# Patient Record
Sex: Male | Born: 1965
Health system: Southern US, Community
[De-identification: ages and names within clinical notes are randomized; demographics above are authoritative.]

## PROBLEM LIST (undated history)

## (undated) DIAGNOSIS — K219 Gastro-esophageal reflux disease without esophagitis: Secondary | ICD-10-CM

## (undated) DIAGNOSIS — A048 Other specified bacterial intestinal infections: Secondary | ICD-10-CM

## (undated) DIAGNOSIS — G4733 Obstructive sleep apnea (adult) (pediatric): Secondary | ICD-10-CM

## (undated) DIAGNOSIS — E785 Hyperlipidemia, unspecified: Secondary | ICD-10-CM

## (undated) DIAGNOSIS — U071 COVID-19: Secondary | ICD-10-CM

## (undated) DIAGNOSIS — E119 Type 2 diabetes mellitus without complications: Secondary | ICD-10-CM

## (undated) DIAGNOSIS — T7840XA Allergy, unspecified, initial encounter: Secondary | ICD-10-CM

## (undated) HISTORY — DX: Hyperlipidemia, unspecified: E78.5

## (undated) HISTORY — DX: Allergy, unspecified, initial encounter: T78.40XA

## (undated) HISTORY — DX: Obstructive sleep apnea (adult) (pediatric): G47.33

## (undated) HISTORY — DX: Other specified bacterial intestinal infections: A04.8

## (undated) HISTORY — PX: SUPERFICIAL LYMPH NODE BIOPSY / EXCISION: SUR127

## (undated) HISTORY — DX: Gastro-esophageal reflux disease without esophagitis: K21.9

---

## 2003-08-16 ENCOUNTER — Emergency Department (HOSPITAL_COMMUNITY): Admission: EM | Admit: 2003-08-16 | Discharge: 2003-08-16 | Payer: Self-pay

## 2009-04-29 ENCOUNTER — Ambulatory Visit (HOSPITAL_COMMUNITY): Admission: RE | Admit: 2009-04-29 | Payer: Self-pay | Source: Ambulatory Visit | Admitting: General Surgery

## 2011-04-04 ENCOUNTER — Encounter (INDEPENDENT_AMBULATORY_CARE_PROVIDER_SITE_OTHER): Payer: Self-pay | Admitting: General Surgery

## 2011-04-18 ENCOUNTER — Other Ambulatory Visit (INDEPENDENT_AMBULATORY_CARE_PROVIDER_SITE_OTHER): Payer: Self-pay | Admitting: General Surgery

## 2011-04-18 ENCOUNTER — Encounter (HOSPITAL_COMMUNITY): Payer: BC Managed Care – PPO | Attending: General Surgery

## 2011-04-18 DIAGNOSIS — Z01812 Encounter for preprocedural laboratory examination: Secondary | ICD-10-CM | POA: Insufficient documentation

## 2011-04-18 LAB — BASIC METABOLIC PANEL
BUN: 17 mg/dL (ref 6–23)
CO2: 25 mEq/L (ref 19–32)
Calcium: 9.7 mg/dL (ref 8.4–10.5)
Creatinine, Ser: 0.95 mg/dL (ref 0.50–1.35)
Glucose, Bld: 89 mg/dL (ref 70–99)

## 2011-04-18 LAB — CBC
Hemoglobin: 15.7 g/dL (ref 13.0–17.0)
MCH: 31.8 pg (ref 26.0–34.0)
MCV: 91.1 fL (ref 78.0–100.0)
RBC: 4.93 MIL/uL (ref 4.22–5.81)

## 2011-04-18 LAB — DIFFERENTIAL
Lymphs Abs: 2 10*3/uL (ref 0.7–4.0)
Monocytes Relative: 9 % (ref 3–12)
Neutro Abs: 1.3 10*3/uL — ABNORMAL LOW (ref 1.7–7.7)
Neutrophils Relative %: 35 % — ABNORMAL LOW (ref 43–77)

## 2011-04-19 ENCOUNTER — Encounter (INDEPENDENT_AMBULATORY_CARE_PROVIDER_SITE_OTHER): Payer: Self-pay | Admitting: General Surgery

## 2011-04-19 ENCOUNTER — Telehealth (INDEPENDENT_AMBULATORY_CARE_PROVIDER_SITE_OTHER): Payer: Self-pay | Admitting: General Surgery

## 2011-04-19 NOTE — Telephone Encounter (Signed)
Spoke to [place] at [employer] pre adm and made aware patient's labs are ok for surgery. Pt scheduled for surgery 04/30/11.

## 2011-04-30 ENCOUNTER — Ambulatory Visit (HOSPITAL_COMMUNITY): Admission: RE | Admit: 2011-04-30 | Payer: BC Managed Care – PPO | Source: Ambulatory Visit | Admitting: General Surgery

## 2011-05-20 ENCOUNTER — Ambulatory Visit (HOSPITAL_COMMUNITY): Admission: RE | Admit: 2011-05-20 | Payer: BC Managed Care – PPO | Source: Ambulatory Visit | Admitting: General Surgery

## 2011-05-22 ENCOUNTER — Encounter (INDEPENDENT_AMBULATORY_CARE_PROVIDER_SITE_OTHER): Payer: Self-pay | Admitting: General Surgery

## 2011-07-10 ENCOUNTER — Ambulatory Visit (INDEPENDENT_AMBULATORY_CARE_PROVIDER_SITE_OTHER): Payer: BC Managed Care – PPO | Admitting: General Surgery

## 2011-07-10 ENCOUNTER — Encounter (INDEPENDENT_AMBULATORY_CARE_PROVIDER_SITE_OTHER): Payer: Self-pay | Admitting: General Surgery

## 2011-07-10 VITALS — BP 130/98 | HR 70 | Temp 96.3°F | Resp 14 | Ht 73.0 in | Wt 226.1 lb

## 2011-07-10 DIAGNOSIS — K602 Anal fissure, unspecified: Secondary | ICD-10-CM | POA: Insufficient documentation

## 2011-07-10 DIAGNOSIS — K648 Other hemorrhoids: Secondary | ICD-10-CM | POA: Insufficient documentation

## 2011-07-10 MED ORDER — AMBULATORY NON FORMULARY MEDICATION: 1.0000 "application " | Freq: Two times a day (BID) | Status: DC

## 2011-07-10 NOTE — Progress Notes (Signed)
Chief Complaint  Patient presents with  . Other    reck hems for surgery     HPI Isaiah Spencer is a 45 y.o. male.  HPI 45 year old [place] American male who initially saw back in June 2012 for internal hemorrhoids comes in today for long-term followup. We eventually scheduled surgery; however, the patient backed out of surgery because he was scared. He states that he still has pain with defecation. He states that he has a bowel movement about every other day. He is now only sitting on the commode for 10 minutes instead of 20 minutes at a time when he was a few months ago. He states that the hemorrhoids still pop out when he has a bowel movement. Most of the time the hemorrhoids will self reduce but occasionally he has to manually reduce them. He still has bleeding with bowel movements on occasion. He states that he will have pain for several more hours after having a bowel movement. He is not taking any supplemental fiber. He is not really eating any food that are high in fiber. He is only drinking one bottle of water per day.  He also states that he has been having trouble swallowing pills and that his reflux is worse. He is scheduled to see Dr. Merla Riches in followup for his reflux problems. Past Medical History  Diagnosis Date  . GERD (gastroesophageal reflux disease)   . Hyperlipemia   . Hemorrhoids     Past Surgical History  Procedure Date  . Superficial lymph node biopsy / excision 90's    non cancerous    Family History  Problem Relation Age of Onset  . Heart disease Mother 31    heart attack     Social History History  Substance Use Topics  . Smoking status: Never Smoker   . Smokeless tobacco: Not on file  . Alcohol Use: Yes     socially    Allergies  Allergen Reactions  . Augmentin   . Penicillins     Current Outpatient Prescriptions  Medication Sig Dispense Refill  . omeprazole (PRILOSEC) 40 MG capsule Take 40 mg by mouth daily.        . AMBULATORY NON  FORMULARY MEDICATION Place 1 application rectally 2 (two) times daily. Medication Name: Nifedipine Ointment 2%  30 g  1    Review of Systems Review of Systems  Constitutional: Negative for fever, activity change, appetite change and unexpected weight change.  HENT: Negative for nosebleeds and congestion.   Eyes: Negative for photophobia and redness.  Respiratory: Positive for cough. Negative for shortness of breath and wheezing.   Cardiovascular: Negative for chest pain and palpitations.  Gastrointestinal: Positive for rectal pain. Negative for abdominal pain and abdominal distention.       See hpi  Genitourinary: Negative for dysuria, hematuria, difficulty urinating and testicular pain.  Musculoskeletal: Negative for back pain and arthralgias.  Skin: Negative for pallor and rash.  Neurological: Negative.   Hematological: Negative.   Psychiatric/Behavioral: Negative.     Blood pressure 130/98, pulse 70, temperature 96.3 F (35.7 C), resp. rate 14, height 6\' 1"  (1.854 m), weight 226 lb 2 oz (102.57 kg).  Physical Exam Physical Exam  Vitals reviewed. Constitutional: He is oriented to person, place, and time. He appears well-developed and well-nourished.  HENT:  Head: Normocephalic and atraumatic.  Eyes: Conjunctivae are normal. No scleral icterus.  Neck: Normal range of motion. Neck supple. No tracheal deviation present.  Cardiovascular: Normal rate and regular rhythm.  Pulmonary/Chest: Effort normal and breath sounds normal. He has no wheezes.  Abdominal: Soft. Bowel sounds are normal. He exhibits no distension. There is no tenderness.       Tiny umbilical hernia  Genitourinary: Rectal exam shows fissure.       Anal fissure in post midline; L lateral mixed column hemorrhoid. DRE & anoscopy deferred secondary to fissure  Musculoskeletal: Normal range of motion. He exhibits no edema.  Lymphadenopathy:    He has no cervical adenopathy.  Neurological: He is alert and oriented to  person, place, and time. He exhibits normal muscle tone.  Skin: Skin is warm and dry.  Psychiatric: He has a normal mood and affect. His behavior is normal. Judgment normal.    Data Reviewed My note from 6/14 which showed grade 3 two-column internal hemorrhoids  Assessment    Acute Anal Fissure Two-column internal hemorrhoids    Plan    He has now developed an anal fissure in addition to his internal hemorrhoids.  I am reluctant to operate on his hemorrhoids at this time given his anal fissure. He was given educational material about anal fissures. I stressed to him the importance of increasing his daily water intake as well as increasing his daily fiber intake. We also talked about the importance of sitz baths. We're going to put him on nifedipine ointment twice a day. I also encouraged him to take supplemental fiber. I will see him back in 6 weeks.       Gaynelle Adu M 07/10/2011, 9:09 AM

## 2011-07-10 NOTE — Patient Instructions (Signed)
Anal Fissure An anal fissure often begins with sharp pain. This is usually following a bowel movement. It often causes bright red blood stained stools. It is the most common cause of rectal bleeding. One common cause of this is passage of a large, hard stool. It can also be caused by having frequent diarrheal stools. Anal fissures that occur for a longtime (chronic) may require surgery. CAUSES  Passing large, hard stools.   Frequent diarrheal stools.   Constipation.  SYMPTOMS  Bright red, blood stained stools.   Rectal bleeding.  HOME CARE INSTRUCTIONS  If constipation is the cause of the rectal fissure, it may be necessary to add a bulk-forming laxative. A diet high in fruits, whole grains, and vegetables will also help.   Taking warm sitz baths for 1 half hour 4 times per day may help.   Increase your fluid intake.   Only take over-the-counter or prescription medicines for pain, discomfort, or fever as directed by your caregiver. Do not take aspirin as this may increase the tendency for bleeding.   Do not use ointments containing anesthetic medications or hydrocortisone. They could slow healing.   Avoid constipating foods such as bananas and cheese.   In children, brown Karo syrup may be used by adding 1 teaspoon of syrup to 8 ounces of formula. An alternative is to give 3 teaspoons of mineral oil every day.  SEEK MEDICAL CARE IF: Rectal bleeding continues, changes in intensity, or becomes more severe. MAKE SURE YOU:   Understand these instructions.   Will watch your condition.   Will get help right away if you are not doing well or get worse.  Document Released: 10/07/2005 Document Re-Released: 01/01/2010 [facility] Patient Information 2011 [place], [place].  GETTING TO GOOD BOWEL HEALTH. Irregular bowel habits such as constipation and diarrhea can lead to many problems over time.  Having one soft bowel movement a day is the most important way to prevent further problems.   The anorectal canal is designed to handle stretching and feces to safely manage our ability to get rid of solid waste (feces, poop, stool) out of our body.  BUT, hard constipated stools can act like ripping concrete bricks and diarrhea can be a burning fire to this very sensitive area of our body, causing inflamed hemorrhoids, anal fissures, increasing risk is perirectal abscesses, abdominal pain/bloating, an making irritable bowel worse.     The goal: ONE SOFT BOWEL MOVEMENT A DAY!  To have soft, regular bowel movements:    Drink at least 8 tall glasses of water a day.     Take plenty of fiber.  Fiber is the undigested part of plant food that passes into the colon, acting s "natures broom" to encourage bowel motility and movement.  Fiber can absorb and hold large amounts of water. This results in a larger, bulkier stool, which is soft and easier to pass. Work gradually over several weeks up to 6 servings a day of fiber (25g a day even more if needed) in the form of: o Vegetables -- Root (potatoes, carrots, turnips), leafy green (lettuce, salad greens, celery, spinach), or cooked high residue (cabbage, broccoli, etc) o Fruit -- Fresh (unpeeled skin & pulp), Dried (prunes, apricots, cherries, etc ),  or stewed ( applesauce)  o Whole grain breads, pasta, etc (whole wheat)  o Bran cereals    Bulking Agents -- This type of water-retaining fiber generally is easily obtained each day by one of the following:  o Psyllium bran -- The  psyllium plant is remarkable because its ground seeds can retain so much water. This product is available as Metamucil, Konsyl, Effersyllium, Per Diem Fiber, or the less expensive generic preparation in drug and health food stores. Although labeled a laxative, it really is not a laxative.  o Methylcellulose -- This is another fiber derived from wood which also retains water. It is available as Citrucel. o Polyethylene Glycol - and "artificial" fiber commonly called Miralax or  Glycolax.  It is helpful for people with gassy or bloated feelings with regular fiber o Flax Seed - a less gassy fiber than psyllium   No reading or other relaxing activity while on the toilet. If bowel movements take longer than 5 minutes, you are too constipated   AVOID CONSTIPATION.  High fiber and water intake usually takes care of this.  Sometimes a laxative is needed to stimulate more frequent bowel movements, but    Laxatives are not a good long-term solution as it can wear the colon out. o Osmotics (Milk of Magnesia, Fleets phosphosoda, Magnesium citrate, MiraLax, GoLytely) are safer than  o Stimulants (Senokot, Castor Oil, Dulcolax, Ex Lax)    o Do not take laxatives for more than 7days in a row.    IF SEVERELY CONSTIPATED, try a Bowel Retraining Program: o Do not use laxatives.  o Eat a diet high in roughage, such as bran cereals and leafy vegetables.  o Drink six (6) ounces of prune or apricot juice each morning.  o Eat two (2) large servings of stewed fruit each day.  o Take one (1) heaping tablespoon of a psyllium-based bulking agent twice a day. Use sugar-free sweetener when possible to avoid excessive calories.  o Eat a normal breakfast.  o Set aside 15 minutes after breakfast to sit on the toilet, but do not strain to have a bowel movement.  o If you do not have a bowel movement by the third day, use an enema and repeat the above steps.    Controlling diarrhea o Switch to liquids and simpler foods for a few days to avoid stressing your intestines further. o Avoid dairy products (especially milk & ice cream) for a short time.  The intestines often can lose the ability to digest lactose when stressed. o Avoid foods that cause gassiness or bloating.  Typical foods include beans and other legumes, cabbage, broccoli, and dairy foods.  Every person has some sensitivity to other foods, so listen to our body and avoid those foods that trigger problems for you. o Adding fiber (Citrucel,  Metamucil, psyllium, Miralax) gradually can help thicken stools by absorbing excess fluid and retrain the intestines to act more normally.  Slowly increase the dose over a few weeks.  Too much fiber too soon can backfire and cause cramping & bloating. o Probiotics (such as active yogurt, Align, etc) may help repopulate the intestines and colon with normal bacteria and calm down a sensitive digestive tract.  Most studies show it to be of mild help, though, and such products can be costly. o Medicines:   Bismuth subsalicylate (ex. Kayopectate, Pepto Bismol) every 30 minutes for up to 6 doses can help control diarrhea.  Avoid if pregnant.   Loperamide (Immodium) can slow down diarrhea.  Start with two tablets (4mg  total) first and then try one tablet every 6 hours.  Avoid if you are having fevers or severe pain.  If you are not better or start feeling worse, stop all medicines and call your doctor for advice  o Call your doctor if you are getting worse or not better.  Sometimes further testing (cultures, endoscopy, X-ray studies, bloodwork, etc) may be needed to help diagnose and treat the cause of the diarrhea.

## 2011-07-22 ENCOUNTER — Telehealth (INDEPENDENT_AMBULATORY_CARE_PROVIDER_SITE_OTHER): Payer: Self-pay | Admitting: General Surgery

## 2011-07-22 NOTE — Telephone Encounter (Signed)
Patient called and left message for me to call him back. I called him back and got voicemail. Left message for him to call me.

## 2011-08-22 ENCOUNTER — Encounter (INDEPENDENT_AMBULATORY_CARE_PROVIDER_SITE_OTHER): Payer: BC Managed Care – PPO | Admitting: General Surgery

## 2011-08-27 ENCOUNTER — Encounter (INDEPENDENT_AMBULATORY_CARE_PROVIDER_SITE_OTHER): Payer: Self-pay | Admitting: General Surgery

## 2011-08-27 ENCOUNTER — Ambulatory Visit (INDEPENDENT_AMBULATORY_CARE_PROVIDER_SITE_OTHER): Payer: BC Managed Care – PPO | Admitting: General Surgery

## 2011-08-27 VITALS — BP 136/94 | HR 76 | Temp 98.4°F | Resp 12 | Ht 73.0 in | Wt 230.6 lb

## 2011-08-27 DIAGNOSIS — K648 Other hemorrhoids: Secondary | ICD-10-CM

## 2011-08-27 DIAGNOSIS — K602 Anal fissure, unspecified: Secondary | ICD-10-CM

## 2011-08-27 MED ORDER — AMBULATORY NON FORMULARY MEDICATION: 1.0000 "application " | Freq: Two times a day (BID) | Status: DC

## 2011-08-27 NOTE — Progress Notes (Signed)
Chief Complaint  Patient presents with  . Follow-up    reck fissure and hem    HPI Isaiah Spencer is a 45 y.o. male.  HPI 45 year old [place] American male who initially saw back in June 2012 for internal hemorrhoids comes in today for long-term followup. We eventually scheduled surgery; however, the patient backed out of surgery because he was scared. At the last visit, he was found to have an anal fissure.  He states that the pain with defecation is much improved.Marland Kitchen He states that he now has a bowel movement about every day. He is now only sitting on the commode for 10 minutes instead of 20 minutes at a time when he was a few months ago. He states that the hemorrhoids still pop out when he has a bowel movement. Most of the time the hemorrhoids will self reduce but occasionally he has to manually reduce them. He still has bleeding with bowel movements on occasion. He states that he will have some discomfort for a few minutes after having a bowel movement. He is not taking any supplemental fiber. He is not really eating any food that are high in fiber. He is only drinking 3-4 bottle of water per day. He got fiber pills after the last visit but hasn't taken any b/c he states he would have to take 20 pills to get 30grams of fiber.  He hasn't used the nifedipine in about 2 weeks b/c he dropped the tube in the toilet.  He also states that he has been dx with OSA.  Past Medical History  Diagnosis Date  . GERD (gastroesophageal reflux disease)   . Hyperlipemia   . Hemorrhoids   . OSA (obstructive sleep apnea)     Past Surgical History  Procedure Date  . Superficial lymph node biopsy / excision 90's    non cancerous    Family History  Problem Relation Age of Onset  . Heart disease Mother 45    heart attack     Social History History  Substance Use Topics  . Smoking status: Never Smoker   . Smokeless tobacco: Not on file  . Alcohol Use: Yes     socially    Allergies  Allergen  Reactions  . Augmentin   . Penicillins     Current Outpatient Prescriptions  Medication Sig Dispense Refill  . omeprazole (PRILOSEC) 40 MG capsule Take 40 mg by mouth daily.        . AMBULATORY NON FORMULARY MEDICATION Place 1 application rectally 2 (two) times daily. Medication Name: Nifedipine Ointment 2%  30 g  1    Review of Systems Review of Systems  Constitutional: Negative for fever, activity change, appetite change and unexpected weight change.  HENT: Negative for nosebleeds and congestion.   Eyes: Negative for photophobia and redness.  Respiratory: Positive for cough. Negative for shortness of breath and wheezing.   Cardiovascular: Negative for chest pain and palpitations.  Gastrointestinal: Positive for rectal pain. Negative for abdominal pain and abdominal distention.       See hpi  Genitourinary: Negative for dysuria, hematuria, difficulty urinating and testicular pain.  Musculoskeletal: Negative for back pain and arthralgias.  Skin: Negative for pallor and rash.  Neurological: Negative.   Hematological: Negative.   Psychiatric/Behavioral: Negative.     Blood pressure 136/94, pulse 76, temperature 98.4 F (36.9 C), temperature source Temporal, resp. rate 12, height 6\' 1"  (1.854 m), weight 230 lb 9.6 oz (104.599 kg).  Physical Exam Physical Exam  Vitals reviewed. Constitutional: He is oriented to person, place, and time. He appears well-developed and well-nourished.  HENT:  Head: Normocephalic and atraumatic.  Eyes: Conjunctivae are normal. No scleral icterus.  Neck: Normal range of motion. Neck supple. No tracheal deviation present.  Cardiovascular: Normal rate and regular rhythm.   Pulmonary/Chest: Effort normal and breath sounds normal. He has no wheezes.  Abdominal: Soft. Bowel sounds are normal. He exhibits no distension. There is no tenderness.       Tiny umbilical hernia  Genitourinary: Rectal exam shows fissure.       Anal fissure in post midline; L  lateral mixed column hemorrhoid. DRE good tone. anoscopy deferred secondary to fissure  Musculoskeletal: Normal range of motion. He exhibits no edema.  Lymphadenopathy:    He has no cervical adenopathy.  Neurological: He is alert and oriented to person, place, and time. He exhibits normal muscle tone.  Skin: Skin is warm and dry.  Psychiatric: He has a normal mood and affect. His behavior is normal. Judgment normal.    Data Reviewed My note from 6/14 which showed grade 3 two-column internal hemorrhoids  Assessment    Acute Anal Fissure Two-column internal hemorrhoids    Plan    He has gotten better with respect to his anal pain so we will continue with non-surgical mgmt for now.  I am still reluctant to operate on his hemorrhoids at this time given his anal fissure. He was given educational material about anal fissures. I stressed to him the importance of increasing his daily water intake as well as increasing his daily fiber intake. I encouraged him to take fiber in powder form as opposed to pills. We also talked about the importance of sitz baths. We're going to put him on nifedipine ointment twice a day. I also encouraged him to take supplemental fiber. I will see him back in 6 weeks.  Encouraged him to f/u with his doctor with respect to his OSA.  Isaiah Spencer. Andrey Campanile, MD, FACS General, Bariatric, & Minimally Invasive Surgery [facility], [place]        [facility] M 08/27/2011, 3:18 PM

## 2011-08-27 NOTE — Patient Instructions (Signed)
Use the nifedipine ointment twice a day  Drink 6-8 glasses of water per day  Use metamucil or Benefiber powder & follow package directions.  It will be easier to reach 30grams of fiber per day with powder than with the fiber pills.  Your fissure is improving which should improve your hemorrhoid

## 2011-10-10 ENCOUNTER — Encounter (INDEPENDENT_AMBULATORY_CARE_PROVIDER_SITE_OTHER): Payer: BC Managed Care – PPO | Admitting: General Surgery

## 2011-11-11 ENCOUNTER — Encounter (INDEPENDENT_AMBULATORY_CARE_PROVIDER_SITE_OTHER): Payer: Self-pay | Admitting: General Surgery

## 2011-11-11 ENCOUNTER — Ambulatory Visit (INDEPENDENT_AMBULATORY_CARE_PROVIDER_SITE_OTHER): Payer: BC Managed Care – PPO | Admitting: General Surgery

## 2011-11-11 VITALS — BP 136/92 | HR 80 | Temp 97.2°F | Resp 18 | Ht 73.0 in | Wt 234.6 lb

## 2011-11-11 DIAGNOSIS — K648 Other hemorrhoids: Secondary | ICD-10-CM

## 2011-11-11 DIAGNOSIS — K602 Anal fissure, unspecified: Secondary | ICD-10-CM

## 2011-11-11 NOTE — Patient Instructions (Signed)
No aspirin or blood thinning products 5 days prior to surgery. Use one fleets enema per rectum the morning of surgery at least 2 hours prior to leaving the house.  

## 2011-11-11 NOTE — Progress Notes (Signed)
Patient ID: Isaiah Spencer, male   DOB: 01/09/1966, 45 y.o.   MRN: [id number]  Chief Complaint  Patient presents with  . Follow-up    reck hems    HPI Isaiah Spencer is a 45 y.o. male.   HPI 45yo AAM last seen in November 2012 for anal fissure and Grade 3 left sided internal hemorrhoid. He has been using the nifedipine ointment twice a day. He no longer has any pain with defecation. He is having daily BMs. However, he states his hemorrhoid will pop out after having a BM which he will manually reduce himself. He has been eating Fiber One bars and drinking 6-8 glasses per day. He is no longer reading in the bathroom. He seldomly has any bleeding per rectum.   Past Medical History  Diagnosis Date  . GERD (gastroesophageal reflux disease)   . Hyperlipemia   . Hemorrhoids   . OSA (obstructive sleep apnea)     Past Surgical History  Procedure Date  . Superficial lymph node biopsy / excision 90's    non cancerous    Family History  Problem Relation Age of Onset  . Heart disease Mother 54    heart attack     Social History History  Substance Use Topics  . Smoking status: Never Smoker   . Smokeless tobacco: Not on file  . Alcohol Use: Yes     socially    Allergies  Allergen Reactions  . Augmentin   . Penicillins     Current Outpatient Prescriptions  Medication Sig Dispense Refill  . AMBULATORY NON FORMULARY MEDICATION Place 1 application rectally 2 (two) times daily. Medication Name: Nifedipine Ointment 2%  30 g  1  . omeprazole (PRILOSEC) 40 MG capsule Take 40 mg by mouth daily.          Review of Systems Review of Systems  Constitutional: Negative for fever, chills, appetite change and unexpected weight change.  HENT: Negative for congestion and trouble swallowing.   Eyes: Negative for visual disturbance.  Respiratory: Negative for chest tightness and shortness of breath.   Cardiovascular: Negative for chest pain and leg swelling.       No PND, no orthopnea, no  DOE  Gastrointestinal:       See HPI  Genitourinary: Negative for dysuria and hematuria.  Musculoskeletal: Negative.   Skin: Negative for rash.  Neurological: Negative for seizures and speech difficulty.  Hematological: Does not bruise/bleed easily.  Psychiatric/Behavioral: Negative for behavioral problems and confusion.    Blood pressure 136/92, pulse 80, temperature 97.2 F (36.2 C), temperature source Temporal, resp. rate 18, height 6' 1" (1.854 m), weight 234 lb 9.6 oz (106.414 kg).  Physical Exam Physical Exam  Vitals reviewed. Constitutional: He is oriented to person, place, and time. He appears well-developed and well-nourished. No distress.  HENT:  Head: Normocephalic and atraumatic.  Eyes: Conjunctivae are normal. No scleral icterus.  Neck: Neck supple. No tracheal deviation present. No thyromegaly present.  Cardiovascular: Normal rate, regular rhythm and normal heart sounds.   Pulmonary/Chest: Effort normal and breath sounds normal. No respiratory distress. He has no wheezes.  Abdominal: Soft. Bowel sounds are normal. He exhibits no distension. There is no tenderness.       Small umbilical hernia  Genitourinary: Rectal exam shows internal hemorrhoid and fissure.       Left posterolateral grade 3 mixed column hemorrhoid. Posterior midline anal fissure  Musculoskeletal: Normal range of motion. He exhibits no edema and no tenderness.  Neurological:   He is alert and oriented to person, place, and time.  Skin: Skin is warm and dry.  Psychiatric: He has a normal mood and affect. His behavior is normal. Thought content normal.    Data Reviewed Previous notes  Assessment    Grade 3 Left posterolateral mixed column int/external hemorrhoid Anal fissure    Plan     Since he is asymptomatic with respect to his fissure, I have recommended we leave his fissure alone during his hemorrhoid surgery.  EUA, excisional hemorrhoidectomy, possible hemorrhoidal banding.  I  discussed the procedure in detail.  The patient was given educational material.  We discussed the risks and benefits of surgery including, but not limited to bleeding, infection, blood clot formation, anesthesia risk, urinary retention, hemorrhoid recurrence, injury to the sphincters resulting in incontinence, possibility of worsening of his anal fissure symptoms, and the rare possibility of anal canal narrowing. I explained that the likelihood of improvement of their symptoms is good  We discussed the typical postoperative course.  I stressed the importance of not becoming constipated after surgery.  The patient was encouraged to limit pain medication if possible as this increases the likelihood of becoming constipated. The patient was advised to take stool softners & drink 8-10 glasses of non-carbonated, non-alcoholic beverages per day and to eat a high fiber diet.  I also encouraged soaking in a water warm bath for 15 minutes at a time several times a day and after a bowel movement.  The patient was advised to take laxatives such as milk of magnesia or Miralax if no bowel movement three days after surgery.  The patient was advised to expect some blood tinged drainage as well as some blood in their bowel movements.   Ilma Achee M. Schuyler Behan, MD, FACS General, Bariatric, & Minimally Invasive Surgery [facility] [place]        Leydi Winstead M 11/11/2011, 6:12 PM    

## 2011-11-12 ENCOUNTER — Encounter (HOSPITAL_COMMUNITY): Payer: Self-pay | Admitting: Respiratory Therapy

## 2011-11-19 ENCOUNTER — Encounter (HOSPITAL_COMMUNITY): Payer: Self-pay

## 2011-11-19 ENCOUNTER — Encounter (HOSPITAL_COMMUNITY)
Admission: RE | Admit: 2011-11-19 | Discharge: 2011-11-19 | Disposition: A | Discharge status: Home / Self Care | Payer: BC Managed Care – PPO | Source: Ambulatory Visit | Attending: General Surgery | Admitting: General Surgery

## 2011-11-19 LAB — CBC
Hemoglobin: 16.4 g/dL (ref 13.0–17.0)
MCH: 32.1 pg (ref 26.0–34.0)
RBC: 5.11 MIL/uL (ref 4.22–5.81)

## 2011-11-19 LAB — DIFFERENTIAL
Eosinophils Absolute: 0.1 10*3/uL (ref 0.0–0.7)
Lymphs Abs: 2.3 10*3/uL (ref 0.7–4.0)
Monocytes Relative: 10 % (ref 3–12)
Neutrophils Relative %: 38 % — ABNORMAL LOW (ref 43–77)

## 2011-11-19 LAB — SURGICAL PCR SCREEN: Staphylococcus aureus: NEGATIVE

## 2011-11-19 NOTE — Pre-Procedure Instructions (Signed)
20 Isaiah Spencer  11/19/2011   Your procedure is scheduled on:  11/21/11  Report to [facility] at 1200 pm  Call this number if you have problems the morning of surgery: [phone]   Remember:   Do not eat food:After Midnight.  May have clear liquids: up to 4 Hours before arrival.  Clear liquids include soda, tea, black coffee, apple or grape juice, broth.  Take these medicines the morning of surgery with A SIP OF WATER: prilosec   Do not wear jewelry, make-up or nail polish.  Do not wear lotions, powders, or perfumes. You may wear deodorant.  Do not shave 48 hours prior to surgery.  Do not bring valuables to the hospital.  Contacts, dentures or bridgework may not be worn into surgery.  Leave suitcase in the car. After surgery it may be brought to your room.  For patients admitted to the hospital, checkout time is 11:00 AM the day of discharge.   Patients discharged the day of surgery will not be allowed to drive home.  Name and phone number of your driver: family  Special Instructions: CHG Shower Use Special Wash: 1/2 bottle night before surgery and 1/2 bottle morning of surgery.   Please read over the following fact sheets that you were given: Pain Booklet, MRSA Information and Surgical Site Infection Prevention

## 2011-11-20 MED ORDER — METRONIDAZOLE IN NACL 5-0.79 MG/ML-% IV SOLN
500.0000 mg | INTRAVENOUS | Status: AC
  Administered 2011-11-21: .5 g via INTRAVENOUS
  Filled 2011-11-20 (×2): qty 100

## 2011-11-21 ENCOUNTER — Encounter (HOSPITAL_COMMUNITY): Payer: Self-pay | Admitting: Anesthesiology

## 2011-11-21 ENCOUNTER — Ambulatory Visit (HOSPITAL_COMMUNITY): Payer: BC Managed Care – PPO | Admitting: Anesthesiology

## 2011-11-21 ENCOUNTER — Other Ambulatory Visit (INDEPENDENT_AMBULATORY_CARE_PROVIDER_SITE_OTHER): Payer: Self-pay | Admitting: General Surgery

## 2011-11-21 ENCOUNTER — Ambulatory Visit (HOSPITAL_COMMUNITY)
Admission: RE | Admit: 2011-11-21 | Discharge: 2011-11-21 | Disposition: A | Discharge status: Home / Self Care | Payer: BC Managed Care – PPO | Source: Ambulatory Visit | Attending: General Surgery | Admitting: General Surgery

## 2011-11-21 ENCOUNTER — Encounter (HOSPITAL_COMMUNITY)
Admission: RE | Disposition: A | Discharge status: Home / Self Care | Payer: Self-pay | Source: Ambulatory Visit | Attending: General Surgery

## 2011-11-21 DIAGNOSIS — K644 Residual hemorrhoidal skin tags: Secondary | ICD-10-CM | POA: Insufficient documentation

## 2011-11-21 DIAGNOSIS — E785 Hyperlipidemia, unspecified: Secondary | ICD-10-CM | POA: Insufficient documentation

## 2011-11-21 DIAGNOSIS — G4733 Obstructive sleep apnea (adult) (pediatric): Secondary | ICD-10-CM | POA: Insufficient documentation

## 2011-11-21 DIAGNOSIS — K648 Other hemorrhoids: Secondary | ICD-10-CM | POA: Insufficient documentation

## 2011-11-21 DIAGNOSIS — K219 Gastro-esophageal reflux disease without esophagitis: Secondary | ICD-10-CM | POA: Insufficient documentation

## 2011-11-21 DIAGNOSIS — K649 Unspecified hemorrhoids: Secondary | ICD-10-CM

## 2011-11-21 DIAGNOSIS — K602 Anal fissure, unspecified: Secondary | ICD-10-CM | POA: Insufficient documentation

## 2011-11-21 HISTORY — PX: EXAMINATION UNDER ANESTHESIA: SHX1540

## 2011-11-21 HISTORY — PX: HEMORRHOID SURGERY: SHX153

## 2011-11-21 SURGERY — HEMORRHOIDECTOMY
Anesthesia: General | Site: Rectum | Wound class: Clean Contaminated

## 2011-11-21 MED ORDER — DROPERIDOL 2.5 MG/ML IJ SOLN
0.6250 mg | INTRAMUSCULAR | Status: DC | PRN
  Administered 2011-11-21: 0.625 mg via INTRAVENOUS

## 2011-11-21 MED ORDER — DIBUCAINE 1 % RE OINT
TOPICAL_OINTMENT | RECTAL | Status: DC | PRN
  Administered 2011-11-21: 1 via RECTAL

## 2011-11-21 MED ORDER — GLYCOPYRROLATE 0.2 MG/ML IJ SOLN
INTRAMUSCULAR | Status: DC | PRN
  Administered 2011-11-21: .4 mg via INTRAVENOUS
  Administered 2011-11-21: 0.1 mg via INTRAVENOUS

## 2011-11-21 MED ORDER — NEOSTIGMINE METHYLSULFATE 1 MG/ML IJ SOLN
INTRAMUSCULAR | Status: DC | PRN
  Administered 2011-11-21: 3 mg via INTRAVENOUS

## 2011-11-21 MED ORDER — BUPIVACAINE-EPINEPHRINE 0.5% -1:200000 IJ SOLN
INTRAMUSCULAR | Status: DC | PRN
  Administered 2011-11-21: 8 mL

## 2011-11-21 MED ORDER — ROCURONIUM BROMIDE 100 MG/10ML IV SOLN
INTRAVENOUS | Status: DC | PRN
  Administered 2011-11-21: 45 mg via INTRAVENOUS

## 2011-11-21 MED ORDER — SODIUM CHLORIDE 0.9 % IJ SOLN
INTRAMUSCULAR | Status: DC | PRN
  Administered 2011-11-21: 20 mL via INTRAVENOUS

## 2011-11-21 MED ORDER — MIDAZOLAM HCL 5 MG/5ML IJ SOLN
INTRAMUSCULAR | Status: DC | PRN
  Administered 2011-11-21: 2 mg via INTRAVENOUS

## 2011-11-21 MED ORDER — HYDROMORPHONE HCL PF 1 MG/ML IJ SOLN: 0.2500 mg | INTRAMUSCULAR | Status: DC | PRN

## 2011-11-21 MED ORDER — OXYCODONE-ACETAMINOPHEN 5-325 MG PO TABS: 1.0000 | ORAL_TABLET | ORAL | Status: AC | PRN

## 2011-11-21 MED ORDER — FENTANYL CITRATE 0.05 MG/ML IJ SOLN
INTRAMUSCULAR | Status: DC | PRN
  Administered 2011-11-21: 50 ug via INTRAVENOUS
  Administered 2011-11-21: 125 ug via INTRAVENOUS

## 2011-11-21 MED ORDER — BUPIVACAINE LIPOSOME 1.3 % IJ SUSP
20.0000 mL | INTRAMUSCULAR | Status: AC
  Administered 2011-11-21: 20 mL
  Filled 2011-11-21: qty 20

## 2011-11-21 MED ORDER — DEXAMETHASONE SODIUM PHOSPHATE 4 MG/ML IJ SOLN
INTRAMUSCULAR | Status: DC | PRN
  Administered 2011-11-21: 4 mg via INTRAVENOUS

## 2011-11-21 MED ORDER — 0.9 % SODIUM CHLORIDE (POUR BTL) OPTIME
TOPICAL | Status: DC | PRN
  Administered 2011-11-21: 1000 mL

## 2011-11-21 MED ORDER — PROPOFOL 10 MG/ML IV EMUL
INTRAVENOUS | Status: DC | PRN
  Administered 2011-11-21: 200 mg via INTRAVENOUS

## 2011-11-21 MED ORDER — ONDANSETRON HCL 4 MG/2ML IJ SOLN
INTRAMUSCULAR | Status: DC | PRN
  Administered 2011-11-21: 4 mg via INTRAVENOUS

## 2011-11-21 MED ORDER — HEMOSTATIC AGENTS (NO CHARGE) OPTIME
TOPICAL | Status: DC | PRN
  Administered 2011-11-21: 1 via TOPICAL

## 2011-11-21 MED ORDER — LACTATED RINGERS IV SOLN
INTRAVENOUS | Status: DC
  Administered 2011-11-21 (×3): via INTRAVENOUS

## 2011-11-21 MED ORDER — DOCUSATE SODIUM 100 MG PO CAPS: 100.0000 mg | ORAL_CAPSULE | Freq: Two times a day (BID) | ORAL | Status: AC

## 2011-11-21 SURGICAL SUPPLY — 50 items
BLADE SURG 15 STRL LF DISP TIS (BLADE) IMPLANT
BLADE SURG 15 STRL SS (BLADE)
CANISTER SUCTION 2500CC (MISCELLANEOUS) ×2 IMPLANT
CLOTH BEACON ORANGE TIMEOUT ST (SAFETY) ×2 IMPLANT
CONT SPEC 4OZ CLIKSEAL STRL BL (MISCELLANEOUS) ×2 IMPLANT
COVER SURGICAL LIGHT HANDLE (MISCELLANEOUS) ×2 IMPLANT
DECANTER SPIKE VIAL GLASS SM (MISCELLANEOUS) ×2 IMPLANT
DRAPE LAPAROTOMY T 102X78X121 (DRAPES) ×2 IMPLANT
DRAPE PROXIMA HALF (DRAPES) IMPLANT
DRAPE UTILITY 15X26 W/TAPE STR (DRAPE) ×4 IMPLANT
DRSG PAD ABDOMINAL 8X10 ST (GAUZE/BANDAGES/DRESSINGS) IMPLANT
ELECT CAUTERY BLADE 6.4 (BLADE) ×2 IMPLANT
ELECT REM PT RETURN 9FT ADLT (ELECTROSURGICAL) ×2
ELECTRODE REM PT RTRN 9FT ADLT (ELECTROSURGICAL) ×1 IMPLANT
GAUZE SPONGE 4X4 16PLY XRAY LF (GAUZE/BANDAGES/DRESSINGS) IMPLANT
GLOVE BIO SURGEON STRL SZ7 (GLOVE) ×2 IMPLANT
GLOVE BIOGEL M STRL SZ7.5 (GLOVE) ×2 IMPLANT
GLOVE BIOGEL PI IND STRL 6.5 (GLOVE) ×1 IMPLANT
GLOVE BIOGEL PI IND STRL 8 (GLOVE) ×1 IMPLANT
GLOVE BIOGEL PI INDICATOR 6.5 (GLOVE) ×1
GLOVE BIOGEL PI INDICATOR 8 (GLOVE) ×1
GLOVE ECLIPSE 6.5 STRL STRAW (GLOVE) ×2 IMPLANT
GLOVE SURG SS PI 6.5 STRL IVOR (GLOVE) ×2 IMPLANT
GOWN STRL NON-REIN LRG LVL3 (GOWN DISPOSABLE) ×4 IMPLANT
GOWN STRL REIN XL XLG (GOWN DISPOSABLE) ×2 IMPLANT
KIT BASIN OR (CUSTOM PROCEDURE TRAY) ×2 IMPLANT
KIT ROOM TURNOVER OR (KITS) ×2 IMPLANT
NEEDLE HYPO 25GX1X1/2 BEV (NEEDLE) ×4 IMPLANT
NEEDLE HYPO 25X1 1.5 SAFETY (NEEDLE) IMPLANT
NS IRRIG 1000ML POUR BTL (IV SOLUTION) ×2 IMPLANT
PACK GENERAL/GYN (CUSTOM PROCEDURE TRAY) ×2 IMPLANT
PACK LITHOTOMY IV (CUSTOM PROCEDURE TRAY) IMPLANT
PACK SURGICAL SETUP 50X90 (CUSTOM PROCEDURE TRAY) IMPLANT
PAD ARMBOARD 7.5X6 YLW CONV (MISCELLANEOUS) ×6 IMPLANT
PENCIL BUTTON HOLSTER BLD 10FT (ELECTRODE) IMPLANT
SHEARS HARMONIC 9CM CVD (BLADE) ×2 IMPLANT
SPONGE GAUZE 4X4 12PLY (GAUZE/BANDAGES/DRESSINGS) IMPLANT
SPONGE SURGIFOAM ABS GEL 100 (HEMOSTASIS) ×2 IMPLANT
SPONGE SURGIFOAM ABS GEL 12-7 (HEMOSTASIS) ×2 IMPLANT
SURGILUBE 2OZ TUBE FLIPTOP (MISCELLANEOUS) ×2 IMPLANT
SUT CHROMIC 2 0 SH (SUTURE) IMPLANT
SUT CHROMIC 3 0 SH 27 (SUTURE) ×4 IMPLANT
SYR CONTROL 10ML LL (SYRINGE) ×4 IMPLANT
TOWEL OR 17X24 6PK STRL BLUE (TOWEL DISPOSABLE) ×2 IMPLANT
TOWEL OR 17X26 10 PK STRL BLUE (TOWEL DISPOSABLE) ×2 IMPLANT
TRAY PROCTOSCOPIC FIBER OPTIC (SET/KITS/TRAYS/PACK) IMPLANT
TUBE CONNECTING 12X1/4 (SUCTIONS) IMPLANT
UNDERPAD 30X30 INCONTINENT (UNDERPADS AND DIAPERS) IMPLANT
WATER STERILE IRR 1000ML POUR (IV SOLUTION) ×2 IMPLANT
YANKAUER SUCT BULB TIP NO VENT (SUCTIONS) IMPLANT

## 2011-11-21 NOTE — Brief Op Note (Signed)
11/21/2011  3:20 PM  PATIENT:  Isaiah Spencer  46 y.o. male  PRE-OPERATIVE DIAGNOSIS:  grade 3 Left internal hemorrhoid Asymptomatic anal fissure  POST-OPERATIVE DIAGNOSIS:  grade 3 left external/internal hemorrhoid Grade 2 Right internal hemorrhoid Asymptomatic anal fissure  PROCEDURE:  Procedure(s): EXCISIONAL HEMORRHOIDECTOMY X 2 EXAM UNDER ANESTHESIA  SURGEON:  Surgeon(s): Atilano Ina, MD  PHYSICIAN ASSISTANT: N/A  ASSISTANTS: none   ANESTHESIA:   general  EBL:  Total I/O In: 1000 [I.V.:1000] Out: -   BLOOD ADMINISTERED:none  DRAINS: large gelfoam in rectum   LOCAL MEDICATIONS USED:  OTHER 40cc exparel  SPECIMEN:  Source of Specimen:  Left lateral int/external hemorrhoid; Right internal hemorrhoid  DISPOSITION OF SPECIMEN:  PATHOLOGY  COUNTS:  YES  TOURNIQUET:  * No tourniquets in log *  DICTATION: .Other Dictation: Dictation Number [id number]  PLAN OF CARE: Discharge to home after PACU  PATIENT DISPOSITION:  PACU - hemodynamically stable.   Delay start of Pharmacological VTE agent (>24hrs) due to surgical blood loss or risk of bleeding:  {YES/NO/NOT APPLICABLE:20182  Mary Sella. Andrey Campanile, MD, FACS General, Bariatric, & Minimally Invasive Surgery [facility], [place]

## 2011-11-21 NOTE — Op Note (Signed)
NAME:  Isaiah Spencer, Isaiah Spencer                ACCOUNT NO.:  [id number]  MEDICAL RECORD NO.:  [id number]  LOCATION:  [facility]                         FACILITY:  [facility]  PHYSICIAN:  Mary Sella. Andrey Campanile, MD     DATE OF BIRTH:  07-27-1966  DATE OF PROCEDURE:  11/21/2011 DATE OF DISCHARGE:  11/21/2011                              OPERATIVE REPORT   PREOPERATIVE DIAGNOSES: 1. Grade 3 left internal hemorrhoid. 2. Asymptomatic anal fissure.  POSTOPERATIVE DIAGNOSES: 1. Grade 3 left external/internal hemorrhoid. 2. Grade 2 right internal hemorrhoid. 3. Asymptomatic anal fissure.  PROCEDURE: 1. Exam under anesthesia. 2. Excisional hemorrhoidectomy x2.  SURGEON:  Mary Sella. Andrey Campanile, MD  ANESTHESIA:  General plus 40 mL of Exparel.  ESTIMATED BLOOD LOSS:  Minimal.  INDICATIONS FOR PROCEDURE:  The patient is a very pleasant 46 year old gentleman who I have been following for several months.  He initially had presented with symptomatic hemorrhoids.  Mainly on the left lateral side that was grade 3.  The patient would have to mainly reduce the hemorrhoid himself.  The patient had poor bowel habits as well as anal fissure.  We instituted nifedipine ointment as well as strategies to improve his bowel habits.  Over the course of several months, the patient became asymptomatic with respect to his fissure.  However, the defect still persisted, but he no longer had pain with defecation.  He also was regular.  He was no longer reading on the toilet.  He was eating a high-fiber diet.  However, his hemorrhoid was still symptomatic.  We discussed several surgical options.  The patient elected to proceed to the operating room for exam under anesthesia and hemorrhoidectomy.  We discussed the risks and benefits of surgery including, but not limited to, bleeding, infection, injury to surrounding structures, anal canal narrowing, blood clot formation, anesthesia risk, urinary retention, hemorrhoidal recurrence, possible need  for additional procedures, wound infection, and abscess recurrence. The patient elects to proceed with surgery.  DESCRIPTION OF PROCEDURE:  After obtaining informed consent, the patient was taken back to the operating room and kept on the stretcher.  General endotracheal anesthesia was established.  He was then placed in the prone jackknife position with the appropriate padding.  Sequential compression devices had been placed.  All pressure points were properly padded.  His buttocks were taped apart.  Perineum was then prepped and draped with Betadine.  He received antibiotics prior to skin incision. Surgical time-out was performed.  On gross inspection of his perineum, there was an external hemorrhoid in the left lateral segment.  I then dilated his anus with 1 finger then 2 fingers.  I then placed an anoscope in then the anus.  There is evidence of a small fissure in the posterior midline.  He had a grade 3 left lateral internal/external hemorrhoid.  He also on the right side had a very broad-based right-sided hemorrhoid.  It was located in the anterior and posterior position.  There was no normal mucosa between the two.  It was one large right-sided hemorrhoid on the right, which was internal. I initially placed some 0.25% Marcaine with epi around the base of the external hemorrhoid on left, and made a  V-shaped incision on the perianal skin with a #15 blade.  I then lifted up the external hemorrhoid from the underlying sphincter.  Then using Harmonic Scalpel, I started to excise the external hemorrhoid and carried it inward.  The external sphincter was preserved and kept out of the way.  I then started to address the internal component.  The blades of the Harmonic Scalpel straddled the internal component to avoid excising too much tissue.  The fibers of the internal sphincter were identified and preserved.  The entire internal component was excised off the underlying internal  sphincter until non-hemorrhoidal bearing rectal mucosa was encountered.  The apex of the internal hemorrhoid was excised using Harmonic Scalpel.  I elected to leave the mucosa open.  I then addressed the right-sided internal hemorrhoid.  There is no external component.  I infiltrated a small amount of 0.25% Marcaine with epi at the base of the internal hemorrhoid.  I then made a similar V-shaped incision with a 15 blade.  I then lifted up the internal hemorrhoid off the underlying internal sphincter with a hemostat.  I then using the Harmonic Scalpel straddled the internal component with blades of Harmonic.  Again, the internal component was excised off the underlying internal sphincter until the non-hemorrhoidal bearing rectal mucosa was encountered.  The apex of the internal hemorrhoid was excised.  This was also somewhat of a large mucosal defect, therefore, I elected to leave the mucosa open. I then infiltrated, at this point  it did 20 minutes, and the site injected with a 0.25% Marcaine, therefore, I injected the Exparel initially in the skin and subcutaneous tissue, and then around the sphincter.  A total 40 mL was injected.  I then placed a piece of Gelfoam that had been rolled into the shape of a cigarette into the anal canal and topical dibucaine ointment was applied around the perineum. 4x4s, fluffs, and mesh underwear were then placed.  The patient was then placed in supine position, extubated, and taken to the recovery room in stable condition.  All needle, instrument, and sponge counts were correct x2.  There are no immediate complications.  The patient tolerated the procedure well.     Mary Sella. Andrey Campanile, MD     EMW/MEDQ  D:  11/21/2011  T:  11/21/2011  Job:  [id number]

## 2011-11-21 NOTE — H&P (View-Only) (Signed)
Patient ID: Isaiah Spencer, male   DOB: 1966/04/06, 46 y.o.   MRN: [id number]  Chief Complaint  Patient presents with  . Follow-up    reck hems    HPI Isaiah Spencer is a 46 y.o. male.   HPI 45yo AAM last seen in November 2012 for anal fissure and Grade 3 left sided internal hemorrhoid. He has been using the nifedipine ointment twice a day. He no longer has any pain with defecation. He is having daily BMs. However, he states his hemorrhoid will pop out after having a BM which he will manually reduce himself. He has been eating Fiber One bars and drinking 6-8 glasses per day. He is no longer reading in the bathroom. He seldomly has any bleeding per rectum.   Past Medical History  Diagnosis Date  . GERD (gastroesophageal reflux disease)   . Hyperlipemia   . Hemorrhoids   . OSA (obstructive sleep apnea)     Past Surgical History  Procedure Date  . Superficial lymph node biopsy / excision 90's    non cancerous    Family History  Problem Relation Age of Onset  . Heart disease Mother 39    heart attack     Social History History  Substance Use Topics  . Smoking status: Never Smoker   . Smokeless tobacco: Not on file  . Alcohol Use: Yes     socially    Allergies  Allergen Reactions  . Augmentin   . Penicillins     Current Outpatient Prescriptions  Medication Sig Dispense Refill  . AMBULATORY NON FORMULARY MEDICATION Place 1 application rectally 2 (two) times daily. Medication Name: Nifedipine Ointment 2%  30 g  1  . omeprazole (PRILOSEC) 40 MG capsule Take 40 mg by mouth daily.          Review of Systems Review of Systems  Constitutional: Negative for fever, chills, appetite change and unexpected weight change.  HENT: Negative for congestion and trouble swallowing.   Eyes: Negative for visual disturbance.  Respiratory: Negative for chest tightness and shortness of breath.   Cardiovascular: Negative for chest pain and leg swelling.       No PND, no orthopnea, no  DOE  Gastrointestinal:       See HPI  Genitourinary: Negative for dysuria and hematuria.  Musculoskeletal: Negative.   Skin: Negative for rash.  Neurological: Negative for seizures and speech difficulty.  Hematological: Does not bruise/bleed easily.  Psychiatric/Behavioral: Negative for behavioral problems and confusion.    Blood pressure 136/92, pulse 80, temperature 97.2 F (36.2 C), temperature source Temporal, resp. rate 18, height 6\' 1"  (1.854 m), weight 234 lb 9.6 oz (106.414 kg).  Physical Exam Physical Exam  Vitals reviewed. Constitutional: He is oriented to person, place, and time. He appears well-developed and well-nourished. No distress.  HENT:  Head: Normocephalic and atraumatic.  Eyes: Conjunctivae are normal. No scleral icterus.  Neck: Neck supple. No tracheal deviation present. No thyromegaly present.  Cardiovascular: Normal rate, regular rhythm and normal heart sounds.   Pulmonary/Chest: Effort normal and breath sounds normal. No respiratory distress. He has no wheezes.  Abdominal: Soft. Bowel sounds are normal. He exhibits no distension. There is no tenderness.       Small umbilical hernia  Genitourinary: Rectal exam shows internal hemorrhoid and fissure.       Left posterolateral grade 3 mixed column hemorrhoid. Posterior midline anal fissure  Musculoskeletal: Normal range of motion. He exhibits no edema and no tenderness.  Neurological:  He is alert and oriented to person, place, and time.  Skin: Skin is warm and dry.  Psychiatric: He has a normal mood and affect. His behavior is normal. Thought content normal.    Data Reviewed Previous notes  Assessment    Grade 3 Left posterolateral mixed column int/external hemorrhoid Anal fissure    Plan     Since he is asymptomatic with respect to his fissure, I have recommended we leave his fissure alone during his hemorrhoid surgery.  EUA, excisional hemorrhoidectomy, possible hemorrhoidal banding.  I  discussed the procedure in detail.  The patient was given Agricultural engineer.  We discussed the risks and benefits of surgery including, but not limited to bleeding, infection, blood clot formation, anesthesia risk, urinary retention, hemorrhoid recurrence, injury to the sphincters resulting in incontinence, possibility of worsening of his anal fissure symptoms, and the rare possibility of anal canal narrowing. I explained that the likelihood of improvement of their symptoms is good  We discussed the typical postoperative course.  I stressed the importance of not becoming constipated after surgery.  The patient was encouraged to limit pain medication if possible as this increases the likelihood of becoming constipated. The patient was advised to take stool softners & drink 8-10 glasses of non-carbonated, non-alcoholic beverages per day and to eat a high fiber diet.  I also encouraged soaking in a water warm bath for 15 minutes at a time several times a day and after a bowel movement.  The patient was advised to take laxatives such as milk of magnesia or Miralax if no bowel movement three days after surgery.  The patient was advised to expect some blood tinged drainage as well as some blood in their bowel movements.   Mary Sella. Andrey Campanile, MD, FACS General, Bariatric, & Minimally Invasive Surgery [facility], [place]        [facility] M 11/11/2011, 6:12 PM

## 2011-11-21 NOTE — Interval H&P Note (Signed)
History and Physical Interval Note:  11/21/2011 1:27 PM  Isaiah Spencer  has presented today for surgery, with the diagnosis of prolapsed grade 3 internal hemorrhoid  The various methods of treatment have been discussed with the patient and family. After consideration of risks, benefits and other options for treatment, the patient has consented to  Procedure(s): HEMORRHOIDECTOMY EXAM UNDER ANESTHESIA as a surgical intervention .  The patients' history has been reviewed, patient examined, no change in status, stable for surgery.  I have reviewed the patients' chart and labs.  Questions were answered to the patient's satisfaction.    Mary Sella. Andrey Campanile, MD, FACS General, Bariatric, & Minimally Invasive Surgery [facility], [place]  [facility] M

## 2011-11-21 NOTE — Transfer of Care (Signed)
Immediate Anesthesia Transfer of Care Note  Patient: Isaiah Spencer  Procedure(s) Performed:  HEMORRHOIDECTOMY - Excisional hemorrhoidectomy times two; EXAM UNDER ANESTHESIA  Patient Location: PACU  Anesthesia Type: General  Level of Consciousness: sedated, patient cooperative and responds to stimulation  Airway & Oxygen Therapy: Patient Spontanous Breathing and Patient connected to nasal cannula oxygen  Post-op Assessment: Report given to PACU RN, Post -op Vital signs reviewed and stable and Patient moving all extremities  Post vital signs: Reviewed and stable  Complications: No apparent anesthesia complications

## 2011-11-21 NOTE — Anesthesia Postprocedure Evaluation (Signed)
Anesthesia Post Note  Patient: Isaiah Spencer  Procedure(s) Performed:  HEMORRHOIDECTOMY - Excisional hemorrhoidectomy times two; EXAM UNDER ANESTHESIA  Anesthesia type: general  Patient location: PACU  Post pain: Pain level controlled  Post assessment: Patient's Cardiovascular Status Stable  Last Vitals:  Filed Vitals:   11/21/11 1630  BP:   Pulse: 84  Temp:   Resp: 17    Post vital signs: Reviewed and stable  Level of consciousness: sedated  Complications: No apparent anesthesia complications

## 2011-11-21 NOTE — Anesthesia Preprocedure Evaluation (Addendum)
Anesthesia Evaluation  Patient identified by MRN, date of birth, ID band Patient awake    Reviewed: Allergy & Precautions, H&P , NPO status , Patient's Chart, lab work & pertinent test results, reviewed documented beta blocker date and time   History of Anesthesia Complications Negative for: history of anesthetic complications  Airway Mallampati: III TM Distance: >3 FB     Dental  (+) Teeth Intact   Pulmonary sleep apnea ,    Pulmonary exam normal       Cardiovascular Regular Normal    Neuro/Psych Negative Neurological ROS  Negative Psych ROS   GI/Hepatic Neg liver ROS, GERD-  Controlled,  Endo/Other  Negative Endocrine ROS  Renal/GU      Musculoskeletal   Abdominal   Peds  Hematology   Anesthesia Other Findings   Reproductive/Obstetrics                           Anesthesia Physical Anesthesia Plan  ASA: II  Anesthesia Plan: General   Post-op Pain Management:    Induction: Intravenous  Airway Management Planned: LMA and Oral ETT  Additional Equipment:   Intra-op Plan:   Post-operative Plan: Extubation in OR  Informed Consent: I have reviewed the patients History and Physical, chart, labs and discussed the procedure including the risks, benefits and alternatives for the proposed anesthesia with the patient or authorized representative who has indicated his/her understanding and acceptance.     Plan Discussed with: CRNA, Anesthesiologist and Surgeon  Anesthesia Plan Comments: (Surgeon requests prone position.)       Anesthesia Quick Evaluation

## 2011-11-22 ENCOUNTER — Telehealth (INDEPENDENT_AMBULATORY_CARE_PROVIDER_SITE_OTHER): Payer: Self-pay

## 2011-11-22 ENCOUNTER — Encounter (HOSPITAL_COMMUNITY): Payer: Self-pay | Admitting: General Surgery

## 2011-11-22 NOTE — Telephone Encounter (Signed)
Pt called in asking about if he can get in a warm sitz bath after surgery yesterday. The pt is worried about his packing falling out so I advise I would check with Dr Andrey Campanile and call him back.  I did speak with Dr Andrey Campanile and he advised pt to get in the sitz bath. The gelfoam will fall out on it's own.

## 2011-11-25 ENCOUNTER — Telehealth (INDEPENDENT_AMBULATORY_CARE_PROVIDER_SITE_OTHER): Payer: Self-pay

## 2011-11-25 NOTE — Telephone Encounter (Signed)
Pt's girlfriend (on his HIPAA) called today.  Pt is having a lot of pain post hemorrhoidectomy.  His BM's are soft, but very painful.  I instructed him to time his pain meds according to when he has a BM if possible.  Also recommended sitting in a warm tub as much as possible.  Also important not to become constipated.

## 2011-11-26 ENCOUNTER — Telehealth: Payer: Self-pay

## 2011-11-26 ENCOUNTER — Telehealth (INDEPENDENT_AMBULATORY_CARE_PROVIDER_SITE_OTHER): Payer: Self-pay

## 2011-11-26 NOTE — Telephone Encounter (Signed)
SPOKE WITH PT AND WOULD LIKE TO SPEAK WITH DR Merla Riches ONLY

## 2011-11-26 NOTE — Telephone Encounter (Signed)
Patient called stating he's having moderate to severe painful muscle spasm sensations, patient reports he takes 4-5 sitz baths daily, taking his pain medication as directed and still no relief.  Reviewed with Dr. Magnus Spencer, suggested Rectiv or Diltiazem cream.  Called patient back and discussed with him the 2 medications suggested by Dr. Magnus Spencer, he did not want to try the creams.  He reports the pain is disrupting his sleep and would like to know if there's a medication we could prescribe that would help with spasms and help him sleep.   I told Isaiah Spencer that I would forward the request to our Urgent Office Physician for further review. Dr. Andrey Spencer on vacation (s/p hemorrhoidectomy x2, anal fissure)

## 2011-11-26 NOTE — Telephone Encounter (Signed)
Per Dr Ezzard Standing pt needs to take pain meds and continue sitz baths.  He should see Dr Andrey Campanile sooner than 2/20 if he is here next week.

## 2011-11-26 NOTE — Telephone Encounter (Signed)
Patient would like for someone to contact him concerning a referral that was requested by Dr Merla Riches and he has some questions that he needs to ask Dr.

## 2011-11-27 ENCOUNTER — Encounter (INDEPENDENT_AMBULATORY_CARE_PROVIDER_SITE_OTHER): Payer: BC Managed Care – PPO | Admitting: Surgery

## 2011-11-28 ENCOUNTER — Telehealth (INDEPENDENT_AMBULATORY_CARE_PROVIDER_SITE_OTHER): Payer: Self-pay | Admitting: General Surgery

## 2011-11-28 NOTE — Telephone Encounter (Signed)
PT CALLED TO ASK ADVICE RE EFFORTS BY HIM TO DELAY HAVING A BOWEL MOVEMENT. THIS WILL MINIMIZE INSTANCES OF PAIN PER BLOGS HE HAS BEEN READING RE OTHER PEOPLE WHO HAVE HAD SIMILAR SURGERY. I ENCOURAGED HIM TO FOLLOW DISCHARGE INSTRUCTIONS AND TO FOLLOW THROUGH WITH BOWEL MOVEMENT IF HE FELT THE URGE. I REMINDED HIM OF FLUID INTAKE AND FIBER. HIS PLAN IS TO EAT LESS IN ORDER TO DELAY  HAVING A BOWEL MOVEMENT. I ENCOURAGED HIM TO CONTINUE ON REGULAR FOOD INTAKE REGIME/ HE WILL KEEP [place] INFORMED/GY/ PAIN/SPASM NOTED AFTER BOWEL MOVEMENT ALONG WITH SOME BLEEDING NOTED.

## 2011-11-29 ENCOUNTER — Telehealth (INDEPENDENT_AMBULATORY_CARE_PROVIDER_SITE_OTHER): Payer: Self-pay | Admitting: General Surgery

## 2011-11-29 ENCOUNTER — Encounter: Payer: Self-pay | Admitting: Internal Medicine

## 2011-11-29 NOTE — Telephone Encounter (Signed)
Patient has not had a bowel movement in two days. Patient is drinking plenty of water, taking stool softeners, and eating. He does not feel the urge to have a bowel movement. He is making sure he is not supposed to be forcing it. I told him as long as he is drinking, eating and passing gas there is no need to force a bowel movement because the strain can be bad on his bottom too. He will keep doing what he is doing and call us with any other symptoms.

## 2011-11-29 NOTE — Telephone Encounter (Signed)
Pull vchart

## 2011-12-02 NOTE — Telephone Encounter (Signed)
Chart to MDs box.

## 2011-12-02 NOTE — Telephone Encounter (Signed)
Bad reflux despite adeq rx...+ ches pain, untreated sleep ap Willf/u sat for eval

## 2011-12-03 ENCOUNTER — Other Ambulatory Visit (INDEPENDENT_AMBULATORY_CARE_PROVIDER_SITE_OTHER): Payer: Self-pay | Admitting: General Surgery

## 2011-12-03 DIAGNOSIS — G8918 Other acute postprocedural pain: Secondary | ICD-10-CM

## 2011-12-03 MED ORDER — OXYCODONE-ACETAMINOPHEN 5-325 MG PO TABS: 1.0000 | ORAL_TABLET | Freq: Four times a day (QID) | ORAL | Status: DC | PRN

## 2011-12-03 MED ORDER — OXYCODONE-ACETAMINOPHEN 5-325 MG PO TABS: 1.0000 | ORAL_TABLET | ORAL | Status: DC | PRN

## 2011-12-03 NOTE — Telephone Encounter (Signed)
Patient called in requesting a refill on His Oxycodone 5/325mg , paged Dr. Andrey Campanile, approved refill Oxycodone 5/325 mg, #50.

## 2011-12-07 ENCOUNTER — Ambulatory Visit (INDEPENDENT_AMBULATORY_CARE_PROVIDER_SITE_OTHER): Payer: BC Managed Care – PPO | Admitting: Internal Medicine

## 2011-12-07 ENCOUNTER — Encounter: Payer: Self-pay | Admitting: Internal Medicine

## 2011-12-07 DIAGNOSIS — R1310 Dysphagia, unspecified: Secondary | ICD-10-CM

## 2011-12-07 DIAGNOSIS — K219 Gastro-esophageal reflux disease without esophagitis: Secondary | ICD-10-CM

## 2011-12-07 DIAGNOSIS — A048 Other specified bacterial intestinal infections: Secondary | ICD-10-CM

## 2011-12-07 DIAGNOSIS — G4730 Sleep apnea, unspecified: Secondary | ICD-10-CM

## 2011-12-07 MED ORDER — ESOMEPRAZOLE MAGNESIUM 40 MG PO CPDR: 40.0000 mg | DELAYED_RELEASE_CAPSULE | Freq: Every day | ORAL | Status: DC

## 2011-12-07 NOTE — Progress Notes (Signed)
  Subjective:    Patient ID: Isaiah Spencer, male    DOB: 08-09-1966, 46 y.o.   MRN: [id number]  HPI This young man follows up with continued problems with esophageal reflux. He now complains of dysphagia. He is not actively vomiting or nauseated. He has symptoms with or without food and has symptoms at night. At his initial evaluation 6 months ago he was noted to be H. Pylori  antibody positive but he neglected to take the antibiotics as prescribed. 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 He has been on omeprazole 40 mg once a day and at times twice a day without any relief he He was referred for a sleep study at that visit 6 months ago as well. It was positive for a complex sleep apnea problem. He elected to forego therapy and intends to recheck this when he goes to [place] to be with his wife for the summer.   Social history-wife works in the home office for time [employer] in [place] He likes his job here and commutes to be with her when he can      Review of Systems No fever weight loss or night sweats When his reflux is active he notices a chest discomfort with swallowing or deep breathing but never with exercise He denies palpitations and angina and there is no history of blood pressure problems GU is negative respiratory has no asthma or chronic cough,though he does note a history of chronic nasal congestion. He has failed with nasal sprays and doesn't want to try them again    Objective:   Physical Exam Vital signs are stable except for obesity There is no thyromegaly Heart has a regular rate and rhythm without murmurs rubs or gallops Abdomen benign Skin reveals several small lipomas on arms without any focal pigmentary change       Assessment & Plan:  Problem #1 esophageal reflux Problem #2 dysphagia Problem #3 obesity Problem #4 sleep apnea  Change to Nexium 40 mg daily Refer for  endoscopy before further consideration of H. Pylori treatment Followup with sleep apnea in [place]

## 2011-12-10 ENCOUNTER — Encounter: Payer: Self-pay | Admitting: Gastroenterology

## 2011-12-11 ENCOUNTER — Ambulatory Visit (INDEPENDENT_AMBULATORY_CARE_PROVIDER_SITE_OTHER): Payer: BC Managed Care – PPO | Admitting: General Surgery

## 2011-12-11 ENCOUNTER — Encounter (INDEPENDENT_AMBULATORY_CARE_PROVIDER_SITE_OTHER): Payer: Self-pay | Admitting: General Surgery

## 2011-12-11 VITALS — BP 126/76 | HR 68 | Temp 97.8°F | Resp 16 | Ht 73.0 in | Wt 226.6 lb

## 2011-12-11 DIAGNOSIS — Z09 Encounter for follow-up examination after completed treatment for conditions other than malignant neoplasm: Secondary | ICD-10-CM

## 2011-12-11 NOTE — Progress Notes (Signed)
Chief complaint: Postop  Procedure: Status post exam under anesthesia, excision of left internal-external hemorrhoid, excision of right internal hemorrhoid 11/21/11  History of Present Ilness: 46 year old [place] American male comes in today for his first postoperative appointment. He states that the first week after surgery was extremely rough and painful. He states that the first 2 bowel movements were extraordinarily painful. He now states that he has essentially no discomfort when he has a bowel movement. He is currently having a bowel movement about every day. Occasionally he may go a day and a half to 2 days without a bowel movement. He is still taking stool softeners. He is no longer taking the narcotics. He denies any melena or hematochezia. He denies any difficulty urinating. He reports a normal diet. He states that sitz baths really help with his discomfort the first week.  Physical Exam: BP 126/76  Pulse 68  Temp(Src) 97.8 F (36.6 C) (Temporal)  Resp 16  Ht 6\' 1"  (1.854 m)  Wt 226 lb 9.6 oz (102.785 kg)  BMI 29.90 kg/m2  Gen: alert, NAD, non-toxic appearing Pupils: equal, no scleral icterus Rectal: no cellulitis or fluctuance. Open left lateral incision- not deep, has some swelling of ext hemorrhoidal tissue in this area. No purulent drainage. DRE deferred   Pathology: Hemorrhoid tissue x 2  Assessment and Plan: Status post exam under anesthesia, incision the left internal-external hemorrhoid and excision of right internal hemorrhoid  Doing better.   Discussed surgical findings and path report.  Re-discussed good bowel habits  F/u 6 weeks  Mary Sella. Andrey Campanile, MD, FACS General, Bariatric, & Minimally Invasive Surgery [facility] Surgery, [place]

## 2011-12-11 NOTE — Patient Instructions (Signed)
Drink 6-8 glasses of water per day Continue taking the stool softner If you go 2 days or more without a bowel movement, take miralax or milk of magnesia

## 2011-12-16 ENCOUNTER — Ambulatory Visit (INDEPENDENT_AMBULATORY_CARE_PROVIDER_SITE_OTHER): Payer: BC Managed Care – PPO | Admitting: Gastroenterology

## 2011-12-16 ENCOUNTER — Encounter: Payer: Self-pay | Admitting: Gastroenterology

## 2011-12-16 DIAGNOSIS — R1310 Dysphagia, unspecified: Secondary | ICD-10-CM

## 2011-12-16 DIAGNOSIS — K219 Gastro-esophageal reflux disease without esophagitis: Secondary | ICD-10-CM

## 2011-12-16 NOTE — Patient Instructions (Addendum)
Gastroesophageal Reflux Disease, Adult Gastroesophageal reflux disease (GERD) happens when acid from your stomach flows up into the esophagus. When acid comes in contact with the esophagus, the acid causes soreness (inflammation) in the esophagus. Over time, GERD may create small holes (ulcers) in the lining of the esophagus. CAUSES   Increased body weight. This puts pressure on the stomach, making acid rise from the stomach into the esophagus.   Smoking. This increases acid production in the stomach.   Drinking alcohol. This causes decreased pressure in the lower esophageal sphincter (valve or ring of muscle between the esophagus and stomach), allowing acid from the stomach into the esophagus.   Late evening meals and a full stomach. This increases pressure and acid production in the stomach.   A malformed lower esophageal sphincter.  Sometimes, no cause is found. SYMPTOMS   Burning pain in the lower part of the mid-chest behind the breastbone and in the mid-stomach area. This may occur twice a week or more often.   Trouble swallowing.   Sore throat.   Dry cough.   Asthma-like symptoms including chest tightness, shortness of breath, or wheezing.  DIAGNOSIS  Your caregiver may be able to diagnose GERD based on your symptoms. In some cases, X-rays and other tests may be done to check for complications or to check the condition of your stomach and esophagus. TREATMENT  Your caregiver may recommend over-the-counter or prescription medicines to help decrease acid production. Ask your caregiver before starting or adding any new medicines.  HOME CARE INSTRUCTIONS   Change the factors that you can control. Ask your caregiver for guidance concerning weight loss, quitting smoking, and alcohol consumption.   Avoid foods and drinks that make your symptoms worse, such as:   Caffeine or alcoholic drinks.   Chocolate.   Peppermint or mint flavorings.   Garlic and onions.   Spicy foods.     Citrus fruits, such as oranges, lemons, or limes.   Tomato-based foods such as sauce, chili, salsa, and pizza.   Fried and fatty foods.   Avoid lying down for the 3 hours prior to your bedtime or prior to taking a nap.   Eat small, frequent meals instead of large meals.   Wear loose-fitting clothing. Do not wear anything tight around your waist that causes pressure on your stomach.   Raise the head of your bed 6 to 8 inches with wood blocks to help you sleep. Extra pillows will not help.   Only take over-the-counter or prescription medicines for pain, discomfort, or fever as directed by your caregiver.   Do not take aspirin, ibuprofen, or other nonsteroidal anti-inflammatory drugs (NSAIDs).  SEEK IMMEDIATE MEDICAL CARE IF:   You have pain in your arms, neck, jaw, teeth, or back.   Your pain increases or changes in intensity or duration.   You develop nausea, vomiting, or sweating (diaphoresis).   You develop shortness of breath, or you faint.   Your vomit is green, yellow, black, or looks like coffee grounds or blood.   Your stool is red, bloody, or black.  These symptoms could be signs of other problems, such as heart disease, gastric bleeding, or esophageal bleeding. MAKE SURE YOU:   Understand these instructions.   Will watch your condition.   Will get help right away if you are not doing well or get worse.  Document Released: 07/17/2005 Document Revised: 06/19/2011 Document Reviewed: 04/26/2011 [facility] Patient Information 2012 [place], [place]  .Dysphagia Swallowing problems (dysphagia) occur when solids and  liquids seem to stick in your throat on the way down to your stomach, or the food takes longer to get to the stomach. Other symptoms (problems) include regurgitating (burping) up food, noises coming from the throat, chest discomfort with swallowing, and a feeling of fullness in the throat when swallowing. When blockage in the throat is complete it may be  associated with drooling. CAUSES There are many causes of swallowing difficulties and the following is generalized information regarding a number of reasons for this problem. Problems with swallowing may occur because of problems with the muscles. The food cannot be propelled in the usual manner into the stomach. There may be ulcers, scar tissueor inflammation (soreness) in the esophagus (the food tube from the mouth to the stomach) which blocks food from passing normally into the stomach. Causes of inflammation include acid reflux from the stomach into the esophagus. Inflammation can also be caused by the herpes simplex virus, Candida (yeast), radiation (as with treatment of cancer), or inflammation from medications not taken with adequate fluids to wash them down into the stomach. There may be nerve problems so signals cannot be sent adequately telling the muscles of the esophagus to contract and move the food along. Achalasia is a rare disorder of the esophagus in which muscular contractions of the esophagus are uncoordinated. Globus hystericus is a relatively common problem in young females in which there is a sense of an obstruction or difficulty in swallowing, but in which no abnormalities can be found. This problem usually improves over time with reassurance and testing to rule out other causes. DIAGNOSIS A number of tests will help your caregiver know what is the cause of your swallowing problems. These tests may include a barium swallow in which x-rays are taken while you are drinking a liquid that outlines the lining of the esophagus on x-ray. If the stomach and small bowel are also studied in this manner it is called an upper gastrointestinal exam (UGI). Endoscopy may be done in which your caregiver examines your throat, esophagus, stomach and small bowel with an instrument like a small flexible telescope. Motility studies which measure the effectiveness and coordination of the muscular contractions  of the esophagus may also be done. TREATMENT The treatment of swallowing problems are many, varying from medications to surgical treatment. The treatment varies with the type of problem found. Your caregiver will discuss your results and treatment with you. If swallowing problems are severe the long term problems which may occur include: malnutrition, pneumonia (from food going into the breathing tubes called trachea and bronchi), and an increase in tumors (lumps) of the esophagus. SEEK IMMEDIATE MEDICAL CARE IF:  Food or other object becomes lodged in your throat or esophagus and won't move.  Document Released: 10/04/2000 Document Revised: 06/19/2011 Document Reviewed: 05/25/2008 [facility] Patient Information 2012 [place], [place]. Your EGD is scheduled on 12/17/2011 at 3:30pm

## 2011-12-16 NOTE — Assessment & Plan Note (Signed)
Continue Nexium 

## 2011-12-16 NOTE — Progress Notes (Signed)
History of Present Illness: Isaiah Spencer is a 46 year old Afro-American male referred at the request of Dr. Merla Riches for evaluation of dysphagia. For several months he has been complaining of dysphagia to solids and liquids. He denies odynophagia. He has intermittent pyrosis and complains of frequent hoarseness.  He currently is taking Nexium.    Past Medical History  Diagnosis Date  . GERD (gastroesophageal reflux disease)   . Hyperlipemia   . Hemorrhoids   . OSA (obstructive sleep apnea)     does not use c pap   Past Surgical History  Procedure Date  . Superficial lymph node biopsy / excision 90's    non cancerous  . Hemorrhoid surgery 11/21/2011    Procedure: HEMORRHOIDECTOMY;  Surgeon: Atilano Ina, MD;  Location: [facility] OR;  Service: General;  Laterality: N/A;  Excisional hemorrhoidectomy times two  . Examination under anesthesia 11/21/2011    Procedure: EXAM UNDER ANESTHESIA;  Surgeon: Atilano Ina, MD;  Location: [facility] OR;  Service: General;  Laterality: N/A;   family history includes Heart disease (age of onset:54) in his mother. Current Outpatient Prescriptions  Medication Sig Dispense Refill  . esomeprazole (NEXIUM) 40 MG capsule Take 1 capsule (40 mg total) by mouth daily.  30 capsule  3   Allergies as of 12/16/2011 - Review Complete 12/16/2011  Allergen Reaction Noted  . Augmentin Other (See Comments) 04/04/2011  . Penicillins Hives 04/04/2011    reports that he has never smoked. He has never used smokeless tobacco. He reports that he drinks alcohol. He reports that he does not use illicit drugs.     Review of Systems: Pertinent positive and negative review of systems were noted in the above HPI section. All other review of systems were otherwise negative.  Vital signs were reviewed in today's medical record Physical Exam: General: Well developed , well nourished, no acute distress Head: Normocephalic and atraumatic Eyes:  sclerae anicteric, EOMI Ears: Normal auditory  acuity Mouth: No deformity or lesions Neck: Supple, no masses or thyromegaly Lungs: Clear throughout to auscultation Heart: Regular rate and rhythm; no murmurs, rubs or bruits Abdomen: Soft, non tender and non distended. No masses, hepatosplenomegaly or hernias noted. Normal Bowel sounds Rectal:deferred Musculoskeletal: Symmetrical with no gross deformities  Skin: No lesions on visible extremities Pulses:  Normal pulses noted Extremities: No clubbing, cyanosis, edema or deformities noted Neurological: Alert oriented x 4, grossly nonfocal Cervical Nodes:  No significant cervical adenopathy Inguinal Nodes: No significant inguinal adenopathy Psychological:  Alert and cooperative. Normal mood and affect

## 2011-12-17 ENCOUNTER — Ambulatory Visit (AMBULATORY_SURGERY_CENTER): Payer: BC Managed Care – PPO | Admitting: Gastroenterology

## 2011-12-17 ENCOUNTER — Encounter: Payer: Self-pay | Admitting: Gastroenterology

## 2011-12-17 DIAGNOSIS — R1310 Dysphagia, unspecified: Secondary | ICD-10-CM

## 2011-12-17 DIAGNOSIS — K222 Esophageal obstruction: Secondary | ICD-10-CM

## 2011-12-17 MED ORDER — SODIUM CHLORIDE 0.9 % IV SOLN: 500.0000 mL | INTRAVENOUS | Status: DC

## 2011-12-17 NOTE — Progress Notes (Signed)
Propofol per s camp crna. See scanned intra procedure report. ewm 

## 2011-12-17 NOTE — Progress Notes (Signed)
Patient did not experience any of the following events: a burn prior to discharge; a fall within the facility; wrong site/side/patient/procedure/implant event; or a hospital transfer or hospital admission upon discharge from the facility. (G8907) Patient did not have preoperative order for IV antibiotic SSI prophylaxis. (G8918)  

## 2011-12-17 NOTE — Op Note (Signed)
[facility] [address] [place], [place]  [address]  ENDOSCOPY PROCEDURE REPORT  PATIENT:  Isaiah Spencer, Isaiah Spencer  MR#:  [id number] BIRTHDATE:  [birth year], 45 yrs. old  GENDER:  male  ENDOSCOPIST:  Barbette Hair. Arlyce Dice, MD Referred by:  Ellamae Sia, M.D.  PROCEDURE DATE:  12/17/2011 PROCEDURE:  EGD, diagnostic 43235, Maloney Dilation of Esophagus ASA CLASS:  Class II INDICATIONS:  dysphagia  MEDICATIONS:   MAC sedation, administered by CRNA propofol 250mg IV, glycopyrrolate (Robinal) 0.2 mg IV, 0.6cc simethancone 0.6 cc PO TOPICAL ANESTHETIC:  DESCRIPTION OF PROCEDURE:   After the risks and benefits of the procedure were explained, informed consent was obtained.  The LB GIF-H180 [id number] endoscope was introduced through the mouth and advanced to the third portion of the duodenum.  The instrument was slowly withdrawn as the mucosa was fully examined. <<PROCEDUREIMAGES>>  A stricture was found at the gastroesophageal junction (see image1 and image4). Early esophageal stricture Dilation with maloney dilator 18mm Moderate resistance; no heme  Otherwise the examination was normal (see image2 and image3).    Retroflexed views revealed no abnormalities.    The scope was then withdrawn from the patient and the procedure completed.  COMPLICATIONS:  None  ENDOSCOPIC IMPRESSION: 1) Stricture at the gastroesophageal junction - s/p maloney dilitation 2) Otherwise normal examination RECOMMENDATIONS: 1) continue PPI 2) return office visit as needed  ______________________________ Barbette Hair. Arlyce Dice, MD  CC:  n. eSIGNED:   Barbette Hair. Zarek Relph at 12/17/2011 04:06 PM  Isaiah Spencer, Isaiah Spencer, [id number]

## 2011-12-17 NOTE — Patient Instructions (Signed)
YOU HAD AN ENDOSCOPIC PROCEDURE TODAY AT THE [facility]: Refer to the procedure report that was given to you for any specific questions about what was found during the examination.  If the procedure report does not answer your questions, please call your gastroenterologist to clarify.  If you requested that your care partner not be given the details of your procedure findings, then the procedure report has been included in a sealed envelope for you to review at your convenience later.  YOU SHOULD EXPECT: Some feelings of bloating in the abdomen. Passage of more gas than usual.  Walking can help get rid of the air that was put into your GI tract during the procedure and reduce the bloating. If you had a lower endoscopy (such as a colonoscopy or flexible sigmoidoscopy) you may notice spotting of blood in your stool or on the toilet paper. If you underwent a bowel prep for your procedure, then you may not have a normal bowel movement for a few days.  DIET: Your first meal following the procedure should be a light meal and then it is ok to progress to your normal diet.  A half-sandwich or bowl of soup is an example of a good first meal.  Heavy or fried foods are harder to digest and may make you feel nauseous or bloated.  Likewise meals heavy in dairy and vegetables can cause extra gas to form and this can also increase the bloating.  Drink plenty of fluids but you should avoid alcoholic beverages for 24 hours.  PLEASE FOLLOW THE ESOPHAGEAL DILATION DIET AS FOLLOWS: -NOTHING BY MOUTH UNTIL 5:00PM -CLEAR LIQUIDS FOR 1 HOUR 5:00PM-6:00PM -SOFT FOODS FOR THE REST OF TODAY 6:00PM UNTIL TOMORROW  ACTIVITY: Your care partner should take you home directly after the procedure.  You should plan to take it easy, moving slowly for the rest of the day.  You can resume normal activity the day after the procedure however you should NOT DRIVE or use heavy machinery for 24 hours (because of the sedation  medicines used during the test).    SYMPTOMS TO REPORT IMMEDIATELY: A gastroenterologist can be reached at any hour.  During normal business hours, 8:30 AM to 5:00 PM Monday through Friday, call [phone].  After hours and on weekends, please call the GI answering service at [phone] who will take a message and have the physician on call contact you.   Following upper endoscopy (EGD)  Vomiting of blood or coffee ground material  New chest pain or pain under the shoulder blades  Painful or persistently difficult swallowing  New shortness of breath  Fever of 100F or higher  Black, tarry-looking stools  FOLLOW UP: If any biopsies were taken you will be contacted by phone or by letter within the next 1-3 weeks.  Call your gastroenterologist if you have not heard about the biopsies in 3 weeks.  Our staff will call the home number listed on your records the next business day following your procedure to check on you and address any questions or concerns that you may have at that time regarding the information given to you following your procedure. This is a courtesy call and so if there is no answer at the home number and we have not heard from you through the emergency physician on call, we will assume that you have returned to your regular daily activities without incident.  SIGNATURES/CONFIDENTIALITY: You and/or your care partner have signed paperwork which will be entered into  your electronic medical record.  These signatures attest to the fact that that the information above on your After Visit Summary has been reviewed and is understood.  Full responsibility of the confidentiality of this discharge information lies with you and/or your care-partner.

## 2011-12-18 ENCOUNTER — Telehealth: Payer: Self-pay | Admitting: *Deleted

## 2011-12-18 NOTE — Telephone Encounter (Signed)
  Follow up Call-  Call back number 12/17/2011  Post procedure Call Back phone  # [phone]  Permission to leave phone message Yes     Patient questions:  Left message to call us if necessary.

## 2012-01-22 ENCOUNTER — Ambulatory Visit (INDEPENDENT_AMBULATORY_CARE_PROVIDER_SITE_OTHER): Payer: BC Managed Care – PPO | Admitting: General Surgery

## 2012-01-22 ENCOUNTER — Encounter (INDEPENDENT_AMBULATORY_CARE_PROVIDER_SITE_OTHER): Payer: Self-pay | Admitting: General Surgery

## 2012-01-22 VITALS — BP 118/68 | HR 74 | Resp 18 | Ht 73.0 in | Wt 232.0 lb

## 2012-01-22 DIAGNOSIS — Z09 Encounter for follow-up examination after completed treatment for conditions other than malignant neoplasm: Secondary | ICD-10-CM

## 2012-01-22 NOTE — Progress Notes (Signed)
Chief complaint: Postop   Procedure: Status post exam under anesthesia, excision of left and right hemorrhoids January 31  History of Present Ilness: 46 year old [place] American male comes in for long-term followup. I last saw him in February. He has no complaints today.  He reports bowel movements at least every other day. He is not drinking lots of water. He denies any pain with defecation. He denies any hemorrhoid protruding after a bowel movement. He denies any straining. He denies any perianal itching or burning he denies any melena or hematochezia.  Physical Exam: BP 118/68  Pulse 74  Resp 18  Ht 6\' 1"  (1.854 m)  Wt 232 lb (105.235 kg)  BMI 30.61 kg/m2  Well-developed well-nourished African American male in no apparent distress Rectal-left lateral incisions completely healed. He has some residual nonthrombosed external hemorrhoid tissue in the left position. Digital rectal exam reveals good tone and no palpable masses  Assessment and Plan: Status post exam under anesthesia, excision of left and right hemorrhoids  He appears to have completely healed. He has some residual nonthrombosed external hemorrhoidal tissue on the left side. My recommendation was to leave this area alone.  I stressed to him the importance of continuing to practice good bowel habits which we rediscuss  Followup p.r.n.  Mary Sella. Andrey Campanile, MD, FACS General, Bariatric, & Minimally Invasive Surgery [facility] Surgery, [place]

## 2012-01-22 NOTE — Patient Instructions (Signed)

## 2012-02-07 ENCOUNTER — Other Ambulatory Visit: Payer: Self-pay | Admitting: *Deleted

## 2012-02-07 DIAGNOSIS — K219 Gastro-esophageal reflux disease without esophagitis: Secondary | ICD-10-CM

## 2012-02-07 MED ORDER — ESOMEPRAZOLE MAGNESIUM 40 MG PO CPDR: 40.0000 mg | DELAYED_RELEASE_CAPSULE | Freq: Every day | ORAL | Status: DC

## 2012-06-18 ENCOUNTER — Ambulatory Visit (INDEPENDENT_AMBULATORY_CARE_PROVIDER_SITE_OTHER): Payer: BC Managed Care – PPO | Admitting: Emergency Medicine

## 2012-06-18 ENCOUNTER — Ambulatory Visit: Payer: BC Managed Care – PPO

## 2012-06-18 VITALS — BP 131/83 | HR 79 | Temp 97.9°F | Resp 16 | Ht 72.5 in | Wt 237.0 lb

## 2012-06-18 DIAGNOSIS — R0602 Shortness of breath: Secondary | ICD-10-CM

## 2012-06-18 DIAGNOSIS — R200 Anesthesia of skin: Secondary | ICD-10-CM

## 2012-06-18 DIAGNOSIS — R609 Edema, unspecified: Secondary | ICD-10-CM

## 2012-06-18 DIAGNOSIS — R209 Unspecified disturbances of skin sensation: Secondary | ICD-10-CM

## 2012-06-18 DIAGNOSIS — M79609 Pain in unspecified limb: Secondary | ICD-10-CM

## 2012-06-18 DIAGNOSIS — M79606 Pain in leg, unspecified: Secondary | ICD-10-CM

## 2012-06-18 LAB — COMPREHENSIVE METABOLIC PANEL
ALT: 21 U/L (ref 0–53)
AST: 18 U/L (ref 0–37)
Alkaline Phosphatase: 81 U/L (ref 39–117)
CO2: 22 mEq/L (ref 19–32)
Sodium: 139 mEq/L (ref 135–145)
Total Bilirubin: 0.6 mg/dL (ref 0.3–1.2)
Total Protein: 7 g/dL (ref 6.0–8.3)

## 2012-06-18 LAB — LIPID PANEL
Cholesterol: 179 mg/dL (ref 0–200)
Triglycerides: 123 mg/dL (ref <150)
VLDL: 25 mg/dL (ref 0–40)

## 2012-06-18 LAB — POCT CBC
Hemoglobin: 16.1 g/dL (ref 14.1–18.1)
Lymph, poc: 2.1 (ref 0.6–3.4)
MCHC: 31.9 g/dL (ref 31.8–35.4)
MID (cbc): 0.3 (ref 0–0.9)
MPV: 10.2 fL (ref 0–99.8)
POC Granulocyte: 1.9 — AB (ref 2–6.9)
POC MID %: 7.8 %M (ref 0–12)
Platelet Count, POC: 242 10*3/uL (ref 142–424)

## 2012-06-18 LAB — POCT URINALYSIS DIPSTICK
Glucose, UA: NEGATIVE
Protein, UA: NEGATIVE
Spec Grav, UA: 1.025
Urobilinogen, UA: 2

## 2012-06-18 LAB — POCT UA - MICROSCOPIC ONLY
Bacteria, U Microscopic: NEGATIVE
Casts, Ur, LPF, POC: NEGATIVE
Crystals, Ur, HPF, POC: NEGATIVE
Mucus, UA: NEGATIVE

## 2012-06-18 LAB — VITAMIN B12: Vitamin B-12: 321 pg/mL (ref 211–911)

## 2012-06-18 LAB — TSH: TSH: 2.18 u[IU]/mL (ref 0.350–4.500)

## 2012-06-18 MED ORDER — HYDROCODONE-ACETAMINOPHEN 7.5-325 MG PO TABS: 1.0000 | ORAL_TABLET | Freq: Four times a day (QID) | ORAL | Status: AC | PRN

## 2012-06-18 MED ORDER — FUROSEMIDE 40 MG PO TABS: 40.0000 mg | ORAL_TABLET | Freq: Every day | ORAL | Status: DC

## 2012-06-18 NOTE — Patient Instructions (Signed)
Edema Edema is an abnormal build-up of fluids in tissues. Because this is partly dependent on gravity (water flows to the lowest place), it is more common in the leg sand thighs (lower extremities). It is also common in the looser tissues, like around the eyes. Painless swelling of the feet and ankles is common and increases as a person ages. It may affect both legs and may include the calves or even thighs. When squeezed, the fluid may move out of the affected area and may leave a dent for a few moments. CAUSES   Prolonged standing or sitting in one place for extended periods of time. Movement helps pump tissue fluid into the veins, and absence of movement prevents this, resulting in edema.   Varicose veins. The valves in the veins do not work as well as they should. This causes fluid to leak into the tissues.   Fluid and salt overload.   Injury, burn, or surgery to the leg, ankle, or foot, may damage veins and allow fluid to leak out.   Sunburn damages vessels. Leaky vessels allow fluid to go out into the sunburned tissues.   Allergies (from insect bites or stings, medications or chemicals) cause swelling by allowing vessels to become leaky.   Protein in the blood helps keep fluid in your vessels. Low protein, as in malnutrition, allows fluid to leak out.   Hormonal changes, including pregnancy and menstruation, cause fluid retention. This fluid may leak out of vessels and cause edema.   Medications that cause fluid retention. Examples are sex hormones, blood pressure medications, steroid treatment, or anti-depressants.   Some illnesses cause edema, especially heart failure, kidney disease, or liver disease.   Surgery that cuts veins or lymph nodes, such as surgery done for the heart or for breast cancer, may result in edema.  DIAGNOSIS  Your caregiver is usually easily able to determine what is causing your swelling (edema) by simply asking what is wrong (getting a history) and examining  you (doing a physical). Sometimes x-rays, EKG (electrocardiogram or heart tracing), and blood work may be done to evaluate for underlying medical illness. TREATMENT  General treatment includes:  Leg elevation (or elevation of the affected body part).   Restriction of fluid intake.   Prevention of fluid overload.   Compression of the affected body part. Compression with elastic bandages or support stockings squeezes the tissues, preventing fluid from entering and forcing it back into the blood vessels.   Diuretics (also called water pills or fluid pills) pull fluid out of your body in the form of increased urination. These are effective in reducing the swelling, but can have side effects and must be used only under your caregiver's supervision. Diuretics are appropriate only for some types of edema.  The specific treatment can be directed at any underlying causes discovered. Heart, liver, or kidney disease should be treated appropriately. HOME CARE INSTRUCTIONS   Elevate the legs (or affected body part) above the level of the heart, while lying down.   Avoid sitting or standing still for prolonged periods of time.   Avoid putting anything directly under the knees when lying down, and do not wear constricting clothing or garters on the upper legs.   Exercising the legs causes the fluid to work back into the veins and lymphatic channels. This may help the swelling go down.   The pressure applied by elastic bandages or support stockings can help reduce ankle swelling.   A low-salt diet may help reduce fluid   retention and decrease the ankle swelling.   Take any medications exactly as prescribed.  SEEK MEDICAL CARE IF:  Your edema is not responding to recommended treatments. SEEK IMMEDIATE MEDICAL CARE IF:   You develop shortness of breath or chest pain.   You cannot breathe when you lay down; or if, while lying down, you have to get up and go to the window to get your breath.   You  are having increasing swelling without relief from treatment.   You develop a fever over 102 F (38.9 C).   You develop pain or redness in the areas that are swollen.   Tell your caregiver right away if you have gained 3 lb/1.4 kg in 1 day or 5 lb/2.3 kg in a week.  MAKE SURE YOU:   Understand these instructions.   Will watch your condition.   Will get help right away if you are not doing well or get worse.  Document Released: 10/07/2005 Document Revised: 09/26/2011 Document Reviewed: 05/25/2008 [employer] Patient Information 2012 [employer]. 

## 2012-06-18 NOTE — Progress Notes (Signed)
Subjective:    Patient ID: Isaiah Spencer, male    DOB: 1966-04-11, 46 y.o.   MRN: [id number]  HPI Patient comes into our office today complaining of swelling and numbness in both legs and feet.The swelling has been going on for 1-2 months. He states that his numbness is everyday patient also states that its hard for him to walk because of the swelling and pain in back of right heel he was playing basketball last Wednesday and that's when the pain in his feet start hurting all the time in the heel area Hx of GERD      Review of Systems  Respiratory: Positive for shortness of breath (all the time ).   Cardiovascular: Positive for leg swelling.  Neurological: Positive for numbness (on both legs and feet everyday).       Objective:   Physical Exam  Constitutional: He appears well-developed and well-nourished.  Eyes: Pupils are equal, round, and reactive to light.  Neck: Normal range of motion. No tracheal deviation present. No thyromegaly present.  Cardiovascular: Normal rate and regular rhythm.   Pulmonary/Chest: Effort normal.       There decreased breath sounds in both bases.   there is 2+ swelling of the ankles and lower legs pulses are 2+ and symmetrical dorsalis pedis and posterior tibial. Results for orders placed in visit on 06/18/12  POCT UA - MICROSCOPIC ONLY      Component Value Range   WBC, Ur, HPF, POC 0-1     RBC, urine, microscopic 2-5     Bacteria, U Microscopic neg     Mucus, UA neg     Epithelial cells, urine per micros 0-1     Crystals, Ur, HPF, POC neg     Casts, Ur, LPF, POC neg     Yeast, UA neg    POCT URINALYSIS DIPSTICK      Component Value Range   Color, UA amber     Clarity, UA clear     Glucose, UA neg     Bilirubin, UA neg     Ketones, UA neg     Spec Grav, UA 1.025     Blood, UA trace-lysed     pH, UA 6.0     Protein, UA neg     Urobilinogen, UA 2.0     Nitrite, UA neg     Leukocytes, UA Negative    POCT CBC      Component Value Range   WBC 4.4 (*) 4.6 - 10.2 K/uL   Lymph, poc 2.1  0.6 - 3.4   POC LYMPH PERCENT 48.1  10 - 50 %L   MID (cbc) 0.3  0 - 0.9   POC MID % 7.8  0 - 12 %M   POC Granulocyte 1.9 (*) 2 - 6.9   Granulocyte percent 44.1  37 - 80 %G   RBC 5.13  4.69 - 6.13 M/uL   Hemoglobin 16.1  14.1 - 18.1 g/dL   HCT, POC 14.7  82.9 - 53.7 %   MCV 98.4 (*) 80 - 97 fL   MCH, POC 31.4 (*) 27 - 31.2 pg   MCHC 31.9  31.8 - 35.4 g/dL   RDW, POC 56.2     Platelet Count, POC 242  142 - 424 K/uL   MPV 10.2  0 - 99.8 fL   EKG first degree block [facility] reading (PRIMARY) by  Dr.Malick Netz no acute disease       Assessment & Plan:  Patient  has 2+ edema of his legs. We'll start Lasix 40 mg one a day for one week and then reevaluate. He's also advised to wear support hose. He did have a slightly elevated MCV raising the issue of a B12 deficiency which would also explain his neuropathy. I also advised him to wear support hose.

## 2012-06-25 ENCOUNTER — Ambulatory Visit (INDEPENDENT_AMBULATORY_CARE_PROVIDER_SITE_OTHER): Payer: BC Managed Care – PPO | Admitting: Emergency Medicine

## 2012-06-25 VITALS — BP 122/90 | HR 82 | Temp 98.3°F | Resp 16 | Ht 72.0 in | Wt 233.0 lb

## 2012-06-25 DIAGNOSIS — R739 Hyperglycemia, unspecified: Secondary | ICD-10-CM

## 2012-06-25 DIAGNOSIS — I10 Essential (primary) hypertension: Secondary | ICD-10-CM

## 2012-06-25 DIAGNOSIS — E789 Disorder of lipoprotein metabolism, unspecified: Secondary | ICD-10-CM

## 2012-06-25 DIAGNOSIS — R7309 Other abnormal glucose: Secondary | ICD-10-CM

## 2012-06-25 LAB — GLUCOSE, POCT (MANUAL RESULT ENTRY): POC Glucose: 237 mg/dl — AB (ref 70–99)

## 2012-06-25 MED ORDER — METFORMIN HCL 500 MG PO TABS: 500.0000 mg | ORAL_TABLET | Freq: Two times a day (BID) | ORAL | Status: DC

## 2012-06-25 MED ORDER — LOSARTAN POTASSIUM 50 MG PO TABS: 50.0000 mg | ORAL_TABLET | Freq: Every day | ORAL | Status: DC

## 2012-06-25 MED ORDER — ATORVASTATIN CALCIUM 20 MG PO TABS: 20.0000 mg | ORAL_TABLET | Freq: Every day | ORAL | Status: DC

## 2012-06-25 NOTE — Patient Instructions (Signed)

## 2012-06-25 NOTE — Progress Notes (Signed)
  Subjective:    Patient ID: Isaiah Spencer, male    DOB: Aug 08, 1966, 46 y.o.   MRN: [id number]  HPI patient asked to return to clinic today because his sugar was high. He was found to have a random sugar elevation him in for repeat    Review of Systems     Objective:   Physical Exam    Results for orders placed in visit on 06/25/12  GLUCOSE, POCT (MANUAL RESULT ENTRY)      Component Value Range   POC Glucose 237 (*) 70 - 99 mg/dl  POCT GLYCOSYLATED HEMOGLOBIN (HGB A1C)      Component Value Range   Hemoglobin A1C 6.3        Assessment & Plan:  We'll go ahead and start Glucophage 500 twice a day. Cozaar 50 one a day. Lipitor 20 one a day. And baby aspirin one a day.

## 2012-06-26 ENCOUNTER — Telehealth: Payer: Self-pay | Admitting: Radiology

## 2012-06-26 DIAGNOSIS — R739 Hyperglycemia, unspecified: Secondary | ICD-10-CM

## 2012-06-26 NOTE — Telephone Encounter (Signed)
Patient called back, states he had eaten candy just before is sugar was checked yesterday, he had forgotten until he got home, he wants to know if he can have this repeated fasting, please advise if we can put in lab only for glucose recheck.

## 2012-06-26 NOTE — Telephone Encounter (Signed)
His HgA1c was also elevated, which would not change if he was fasting.  But I can certainly re-order a fasting glucose as a lab only if he would like.

## 2012-06-26 NOTE — Telephone Encounter (Signed)
I have called patient to advise this would not change his treatment. He does not want to return for further labs.

## 2012-07-30 ENCOUNTER — Ambulatory Visit (INDEPENDENT_AMBULATORY_CARE_PROVIDER_SITE_OTHER): Payer: BC Managed Care – PPO | Admitting: Family Medicine

## 2012-07-30 ENCOUNTER — Encounter: Payer: Self-pay | Admitting: Family Medicine

## 2012-07-30 VITALS — BP 120/82 | HR 88 | Temp 97.8°F | Resp 16 | Ht 72.5 in | Wt 236.8 lb

## 2012-07-30 DIAGNOSIS — E119 Type 2 diabetes mellitus without complications: Secondary | ICD-10-CM | POA: Insufficient documentation

## 2012-07-30 DIAGNOSIS — M7989 Other specified soft tissue disorders: Secondary | ICD-10-CM

## 2012-07-30 DIAGNOSIS — K219 Gastro-esophageal reflux disease without esophagitis: Secondary | ICD-10-CM

## 2012-07-30 DIAGNOSIS — R05 Cough: Secondary | ICD-10-CM

## 2012-07-30 LAB — POCT CBC
Granulocyte percent: 53.8 %G (ref 37–80)
MCH, POC: 31.4 pg — AB (ref 27–31.2)
MCV: 97 fL (ref 80–97)
MID (cbc): 0.4 (ref 0–0.9)
MPV: 9.8 fL (ref 0–99.8)
POC LYMPH PERCENT: 39.2 %L (ref 10–50)
POC MID %: 7 %M (ref 0–12)
Platelet Count, POC: 275 10*3/uL (ref 142–424)
RBC: 5.23 M/uL (ref 4.69–6.13)
RDW, POC: 12.7 %
WBC: 5.3 10*3/uL (ref 4.6–10.2)

## 2012-07-30 LAB — POCT GLYCOSYLATED HEMOGLOBIN (HGB A1C): Hemoglobin A1C: 7.1

## 2012-07-30 LAB — BASIC METABOLIC PANEL
CO2: 27 mEq/L (ref 19–32)
Calcium: 10.1 mg/dL (ref 8.4–10.5)
Glucose, Bld: 128 mg/dL — ABNORMAL HIGH (ref 70–99)
Potassium: 4.2 mEq/L (ref 3.5–5.3)
Sodium: 140 mEq/L (ref 135–145)

## 2012-07-30 MED ORDER — ESOMEPRAZOLE MAGNESIUM 40 MG PO CPDR: 40.0000 mg | DELAYED_RELEASE_CAPSULE | Freq: Every day | ORAL | Status: DC

## 2012-07-30 NOTE — Patient Instructions (Addendum)
We will contact you about your ultrasound appointment.  If you are not feeling better (from your cough) within a week please let us know- sooner if you are getting worse!    I will contact you regarding your labs

## 2012-07-30 NOTE — Progress Notes (Signed)
[facility] [address] [place] [address] [phone]  Date:  07/30/2012   Name:  Isaiah Spencer   DOB:  1966/07/29   MRN:  [id number]  PCP:  Tonye Pearson, MD    Chief Complaint: Cough, Mass, Diabetes and Gastrophageal Reflux   History of Present Illness:  Isaiah Spencer is a 46 y.o. very pleasant male patient who presents with the following:  He needs a visit to renew his nexium rx. He uses nexium for GERD and it is helpful.  He started on glucophage about a month ago for new diagnosis of DM 11.   He has had a cough for about a week.  He notes that when he coughs it "sends shocks through my body."  He has some phlegm in his throat but cannot seem to bring it up.    Overall he does not feel bad.  He does not note a fever or chills.  He did have some sinus symptoms, but these seem to have cleared up.  He thinks he is just getting over a cold but is concerned about the sensations he gets when he coughs  He is also concerned about a spot/ mass on his left thigh.  It has been present for a year or so.  He thought it started with an ingrown hair, but it seemed to get larger after he tried to pop it.  It does not hurt.  He has a similar but smaller area on his right arm.  Neither are painful but he is interested in finding out what they are  He has eaten just a few crackers so far today.    Patient Active Problem List  Diagnosis  . Internal hemorrhoids  . GERD (gastroesophageal reflux disease)  . H. pylori infection  . Dysphagia, unspecified  . Diabetes mellitus, type 2    Past Medical History  Diagnosis Date  . GERD (gastroesophageal reflux disease)   . Hyperlipemia   . Hemorrhoids   . OSA (obstructive sleep apnea)     does not use c pap  . Allergy   . Asthma     AS A CHILD    Past Surgical History  Procedure Date  . Superficial lymph node biopsy / excision 90's    non cancerous  . Hemorrhoid surgery 11/21/2011    Procedure:  HEMORRHOIDECTOMY;  Surgeon: Atilano Ina, MD;  Location: [facility] OR;  Service: General;  Laterality: N/A;  Excisional hemorrhoidectomy times two  . Examination under anesthesia 11/21/2011    Procedure: EXAM UNDER ANESTHESIA;  Surgeon: Atilano Ina, MD;  Location: [facility] OR;  Service: General;  Laterality: N/A;    History  Substance Use Topics  . Smoking status: Never Smoker   . Smokeless tobacco: Never Used  . Alcohol Use: No     socially    Family History  Problem Relation Age of Onset  . Heart disease Mother 2    heart attack   . Diabetes Mother   . Hypertension Mother   . Colon cancer Neg Hx   . Rectal cancer Neg Hx   . Stomach cancer Neg Hx   . Esophageal cancer Neg Hx     Allergies  Allergen Reactions  . Amoxicillin-Pot Clavulanate Other (See Comments)    Upsets stomach  . Penicillins Hives    Upper body/chest area    Medication list has been reviewed and updated.  Current Outpatient Prescriptions on File Prior to Visit  Medication Sig Dispense Refill  . atorvastatin (LIPITOR) 20 MG tablet Take 1 tablet (20 mg total) by mouth daily.  30 tablet  11  . esomeprazole (NEXIUM) 40 MG capsule Take 1 capsule (40 mg total) by mouth daily.  90 capsule  0  . furosemide (LASIX) 40 MG tablet Take 1 tablet (40 mg total) by mouth daily.  30 tablet  1  . losartan (COZAAR) 50 MG tablet Take 1 tablet (50 mg total) by mouth daily.  30 tablet  11  . metFORMIN (GLUCOPHAGE) 500 MG tablet Take 1 tablet (500 mg total) by mouth 2 (two) times daily with a meal.  180 tablet  11  . docusate sodium (COLACE) 100 MG capsule Take 100 mg by mouth daily.      Marland Kitchen oxyCODONE-acetaminophen (PERCOCET) 5-325 MG per tablet         Review of Systems:  As per HPI- otherwise negative.   Physical Examination: Filed Vitals:   07/30/12 0940  BP: 120/82  Pulse: 88  Temp: 97.8 F (36.6 C)  Resp: 16   Filed Vitals:   07/30/12 0940  Height: 6' 0.5" (1.842 m)  Weight: 236 lb 12.8 oz (107.412 kg)   Body  mass index is 31.67 kg/(m^2). Ideal Body Weight: Weight in (lb) to have BMI = 25: 186.5   GEN: WDWN, NAD, Non-toxic, A & O x 3, muscular build HEENT: Atraumatic, Normocephalic. Neck supple. No masses, No LAD. Bilateral TM wnl, oropharynx normal.  PEERL,EOMI.   Ears and Nose: No external deformity. CV: RRR, No M/G/R. No JVD. No thrill. No extra heart sounds. PULM: CTA B, no wheezes, crackles, rhonchi. No retractions. No resp. distress. No accessory muscle use. ABD: S, NT, ND, +BS. No rebound. No HSM. EXTR: No c/c/e.  There is a mobile cyst in the sub cue tissues of the left anterior thigh.  It is about 2cm in diameter, smooth edges and freely mobile.  He has a similar, but smaller finding on his ventral left forearm.   NEURO Normal gait.  PSYCH: Normally interactive. Conversant. Not depressed or anxious appearing.  Calm demeanor.   Results for orders placed in visit on 07/30/12  POCT GLYCOSYLATED HEMOGLOBIN (HGB A1C)      Component Value Range   Hemoglobin A1C 7.1    POCT CBC      Component Value Range   WBC 5.3  4.6 - 10.2 K/uL   Lymph, poc 2.1  0.6 - 3.4   POC LYMPH PERCENT 39.2  10 - 50 %L   MID (cbc) 0.4  0 - 0.9   POC MID % 7.0  0 - 12 %M   POC Granulocyte 2.9  2 - 6.9   Granulocyte percent 53.8  37 - 80 %G   RBC 5.23  4.69 - 6.13 M/uL   Hemoglobin 16.4  14.1 - 18.1 g/dL   HCT, POC 16.1  09.6 - 53.7 %   MCV 97.0  80 - 97 fL   MCH, POC 31.4 (*) 27 - 31.2 pg   MCHC 32.3  31.8 - 35.4 g/dL   RDW, POC 04.5     Platelet Count, POC 275  142 - 424 K/uL   MPV 9.8  0 - 99.8 fL    Assessment and Plan: 1. Diabetes mellitus, type 2  POCT glycosylated hemoglobin (Hb A1C), Basic metabolic panel  2. GERD (gastroesophageal reflux disease)  esomeprazole (NEXIUM) 40 MG capsule  3. Cough  POCT CBC  4. Cyst of soft tissue  US Misc Soft Tissue   Will follow-up CBC and A1c with BMP result.  DM is under ok control, but will discuss diet with him with lab follow-up.  Likely benign cysts under  skin on left thigh and right arm- will ultrasound to characterize further.  He is getting over a recent illness. Does not feel bad overall, but he is having some strange sensations when he coughs- like a "shock" through his body.  It may be difficult to determine the exact cause of this symptom, but it does not seem to be anything dangerous.  He will let us know it it continues or gets worse.    Refilled his nexium today for a year  Celise Bazar, MD

## 2012-07-31 ENCOUNTER — Telehealth: Payer: Self-pay | Admitting: Radiology

## 2012-07-31 DIAGNOSIS — R2230 Localized swelling, mass and lump, unspecified upper limb: Secondary | ICD-10-CM

## 2012-07-31 DIAGNOSIS — R2240 Localized swelling, mass and lump, unspecified lower limb: Secondary | ICD-10-CM

## 2012-07-31 NOTE — Telephone Encounter (Signed)
Order corrected for [place] Amy

## 2012-08-10 ENCOUNTER — Ambulatory Visit
Admission: RE | Admit: 2012-08-10 | Discharge: 2012-08-10 | Disposition: A | Discharge status: Home / Self Care | Payer: BC Managed Care – PPO | Source: Ambulatory Visit | Attending: Family Medicine | Admitting: Family Medicine

## 2012-08-10 ENCOUNTER — Other Ambulatory Visit: Payer: Self-pay | Admitting: Family Medicine

## 2012-08-10 DIAGNOSIS — R2230 Localized swelling, mass and lump, unspecified upper limb: Secondary | ICD-10-CM

## 2012-08-10 DIAGNOSIS — R2240 Localized swelling, mass and lump, unspecified lower limb: Secondary | ICD-10-CM

## 2012-08-21 ENCOUNTER — Telehealth: Payer: Self-pay

## 2012-08-21 DIAGNOSIS — R609 Edema, unspecified: Secondary | ICD-10-CM

## 2012-08-21 MED ORDER — FUROSEMIDE 40 MG PO TABS: 40.0000 mg | ORAL_TABLET | Freq: Every day | ORAL | Status: DC

## 2012-08-21 NOTE — Telephone Encounter (Signed)
Called patient, per Dr. Patsy Lager, to ask patient if the Lasix works for him and if he is using Lasix everyday.  Patient states that the Lasix does help, but his prescription ran out last week.  He would like a refill.

## 2012-08-21 NOTE — Telephone Encounter (Signed)
I spoke with Isaiah Spencer and let him know that Dr.Copland agreed to refill his Lasix RX, and he should check with his pharmacy later today. I notified him to follow up in a couple of months to have his electrolytes and kidney function checked.

## 2012-08-21 NOTE — Telephone Encounter (Signed)
Please call and see how he is doing- is he using the lasix every day?  Does it seem to help?

## 2012-08-21 NOTE — Telephone Encounter (Signed)
Pt states pharmacy sent over a refill request for his Lasix. Can we refill this or does he need to RTC. He states he is still swelling and has missed 4 days of his medicine.

## 2012-08-21 NOTE — Telephone Encounter (Signed)
Isaiah Spencer has been using lasix successful for LE edema since August.  We did labs in October which looked ok.  We can refill his lasix, and he is instructed to RTC for a recheck and labs in the next couple of months

## 2012-11-02 ENCOUNTER — Ambulatory Visit (INDEPENDENT_AMBULATORY_CARE_PROVIDER_SITE_OTHER): Payer: BC Managed Care – PPO | Admitting: Internal Medicine

## 2012-11-02 VITALS — BP 114/78 | HR 90 | Temp 97.7°F | Resp 18 | Ht 73.0 in | Wt 240.0 lb

## 2012-11-02 DIAGNOSIS — J329 Chronic sinusitis, unspecified: Secondary | ICD-10-CM

## 2012-11-02 DIAGNOSIS — E119 Type 2 diabetes mellitus without complications: Secondary | ICD-10-CM

## 2012-11-02 DIAGNOSIS — R5381 Other malaise: Secondary | ICD-10-CM

## 2012-11-02 DIAGNOSIS — G4733 Obstructive sleep apnea (adult) (pediatric): Secondary | ICD-10-CM

## 2012-11-02 DIAGNOSIS — R5383 Other fatigue: Secondary | ICD-10-CM

## 2012-11-02 DIAGNOSIS — R1310 Dysphagia, unspecified: Secondary | ICD-10-CM

## 2012-11-02 DIAGNOSIS — K219 Gastro-esophageal reflux disease without esophagitis: Secondary | ICD-10-CM

## 2012-11-02 LAB — POCT GLYCOSYLATED HEMOGLOBIN (HGB A1C): Hemoglobin A1C: 8.2

## 2012-11-02 LAB — POCT CBC
Granulocyte percent: 46 %G (ref 37–80)
HCT, POC: 48.2 % (ref 43.5–53.7)
Hemoglobin: 15.4 g/dL (ref 14.1–18.1)
Lymph, poc: 2.6 (ref 0.6–3.4)
POC Granulocyte: 2.6 (ref 2–6.9)

## 2012-11-02 MED ORDER — AZITHROMYCIN 250 MG PO TABS: ORAL_TABLET | ORAL | Status: DC

## 2012-11-02 MED ORDER — SUCRALFATE 1 GM/10ML PO SUSP: 1.0000 g | Freq: Four times a day (QID) | ORAL | Status: DC

## 2012-11-02 MED ORDER — METFORMIN HCL ER (MOD) 1000 MG PO TB24: 1000.0000 mg | ORAL_TABLET | Freq: Every day | ORAL | Status: DC

## 2012-11-02 MED ORDER — IPRATROPIUM BROMIDE 0.06 % NA SOLN: 2.0000 | Freq: Four times a day (QID) | NASAL | Status: DC

## 2012-11-02 NOTE — Progress Notes (Deleted)
  Subjective:    Patient ID: Isaiah Spencer, male    DOB: 06-24-1966, 47 y.o.   MRN: [id number]  HPI    Review of Systems     Objective:   Physical Exam          Assessment & Plan:

## 2012-11-02 NOTE — Progress Notes (Signed)
Subjective:    Patient ID: Isaiah Spencer, male    DOB: November 07, 1965, 47 y.o.   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 Has a h/o chronic sinusitis year round. Uses an OTC nasal spray qd. Has ST and slight laryngitis. Hated Flonase-no help No fever, no change in appetite.  Very fatigued. Sleeps ok, but not really a lot, 4-5 hrs at most. Has been worse the last month. No problems falling asleep but wakes a lot during the night because he feels like he's choking. Snores very loudly. Sleep study 2 years ago suggest a complex problem which he never followed through to treat due to his travel at the time.  Has acid reflux and takes Nexium. Had a dilation procedure last summer for his esophagus by Dr. Arlyce Dice, but this was for a stricture at the GE junction and there was no mention of any problem in the upper esophagus.  Diagnosed August 2013 by Dr.Daub with diabetes and started on metformin. His hemoglobin A1c has steadily increased since that time/he has a difficult time taking both doses of his medication daily   Review of Systems No weight loss or night sweats/no change in activity No visual changes No shortness of breath No chest pain or palpitations No edema    Objective:   Physical Exam BP 114/78  Pulse 90  Temp 97.7 F (36.5 C) (Oral)  Resp 18  Ht 6\' 1"  (1.854 m)  Wt 240 lb (108.863 kg)  BMI 31.66 kg/m2  SpO2 97%  Pupils equal round reactive to light and accommodation/EOMs conjugate Nares. Boggy with little air space Throat shallow No nodes or thyromegaly Heart regular without murmur Lungs clear   Results for orders placed in visit on 11/02/12  POCT CBC      Component Value Range   WBC 5.6  4.6 - 10.2 K/uL   Lymph, poc 2.6  0.6 - 3.4   POC LYMPH PERCENT 47.2  10 - 50 %L   MID (cbc) 0.4  0 - 0.9   POC MID % 6.8  0 -  12 %M   POC Granulocyte 2.6  2 - 6.9   Granulocyte percent 46.0  37 - 80 %G   RBC 4.96  4.69 - 6.13 M/uL   Hemoglobin 15.4  14.1 - 18.1 g/dL   HCT, POC 29.5  62.1 - 53.7 %   MCV 97.2 (*) 80 - 97 fL   MCH, POC 31.0  27 - 31.2 pg   MCHC 32.0  31.8 - 35.4 g/dL   RDW, POC 30.8     Platelet Count, POC 282  142 - 424 K/uL   MPV 9.7  0 - 99.8 fL  POCT GLYCOSYLATED HEMOGLOBIN (HGB A1C)      Component Value Range   Hemoglobin A1C 8.2          Assessment & Plan:   1. Diabetes    2. Other malaise and fatigue    3. Sinusitis    4. GERD (gastroesophageal reflux disease)    5. Dysphagia, unspecified    6. OSA (obstructive sleep apnea)     This is a complex situation/he should start with ENT evaluation to consider his chronic sinus problems, his sleep apnea and his recent dysphagia  Will change his metformin to once a day dosing  Add Carafate to see if  there is symptomatic relief  Meds ordered this encounter  Medications  . metFORMIN (GLUMETZA) 1000 MG (MOD) 24 hr tablet    Sig: Take 1 tablet (1,000 mg total) by mouth daily with breakfast.    Dispense:  90 tablet    Refill:  1  . sucralfate (CARAFATE) 1 GM/10ML suspension    Sig: Take 10 mLs (1 g total) by mouth 4 (four) times daily. For 10 days    Dispense:  420 mL    Refill:  0  . azithromycin (ZITHROMAX) 250 MG tablet    Sig: As packaged    Dispense:  6 tablet    Refill:  0  . ipratropium (ATROVENT) 0.06 % nasal spray    Sig: Place 2 sprays into the nose 4 (four) times daily.    Dispense:  15 mL    Refill:  12

## 2012-11-03 ENCOUNTER — Encounter: Payer: Self-pay | Admitting: Internal Medicine

## 2012-11-03 LAB — COMPREHENSIVE METABOLIC PANEL
ALT: 38 U/L (ref 0–53)
Albumin: 4.2 g/dL (ref 3.5–5.2)
CO2: 24 mEq/L (ref 19–32)
Calcium: 9.7 mg/dL (ref 8.4–10.5)
Chloride: 104 mEq/L (ref 96–112)
Potassium: 3.9 mEq/L (ref 3.5–5.3)
Sodium: 140 mEq/L (ref 135–145)
Total Protein: 7.1 g/dL (ref 6.0–8.3)

## 2012-11-03 LAB — TSH: TSH: 2.365 u[IU]/mL (ref 0.350–4.500)

## 2012-11-04 ENCOUNTER — Telehealth: Payer: Self-pay

## 2012-11-04 NOTE — Telephone Encounter (Signed)
Patient needs clarification of medications changed with Dr. Merla Riches  CBN ..[phone]

## 2012-11-05 NOTE — Telephone Encounter (Signed)
Patient is questioning if he should continue losartin, lasix, and lipitor. I have advised him what these meds are for and he will continue, he thought they were all for his diabetes. I advised him the only thing he is to d/c is the regular metformin and he is to take the once daily dose of Glumetza.

## 2012-11-05 NOTE — Telephone Encounter (Signed)
Left message for patient to return call.

## 2012-11-09 ENCOUNTER — Encounter: Payer: Self-pay | Admitting: Internal Medicine

## 2012-11-17 ENCOUNTER — Telehealth: Payer: Self-pay

## 2012-11-17 ENCOUNTER — Other Ambulatory Visit: Payer: Self-pay | Admitting: Otolaryngology

## 2012-11-17 DIAGNOSIS — R1310 Dysphagia, unspecified: Secondary | ICD-10-CM

## 2012-11-17 MED ORDER — OMEPRAZOLE 40 MG PO CPDR: 40.0000 mg | DELAYED_RELEASE_CAPSULE | Freq: Every day | ORAL | Status: DC

## 2012-11-17 NOTE — Telephone Encounter (Signed)
Try generic prilosex. rx sent to pharmacy

## 2012-11-17 NOTE — Telephone Encounter (Signed)
PT STATES THE NEXIUM HE WAS GIVEN IS TO EXPENSIVE. WOULD LIKE TO HAVE THE GENERIC FORM UNTIL ABLE TO GET THE NEXIUM PLEASE CALL [phone]   [facility] ON [address]

## 2012-11-17 NOTE — Telephone Encounter (Signed)
Please advise on alternative.  

## 2012-11-18 NOTE — Telephone Encounter (Signed)
lmom that med was sent into pharmacy

## 2012-11-24 ENCOUNTER — Ambulatory Visit
Admission: RE | Admit: 2012-11-24 | Discharge: 2012-11-24 | Disposition: A | Discharge status: Home / Self Care | Payer: BC Managed Care – PPO | Source: Ambulatory Visit | Attending: Otolaryngology | Admitting: Otolaryngology

## 2012-11-24 DIAGNOSIS — R1310 Dysphagia, unspecified: Secondary | ICD-10-CM

## 2013-01-22 ENCOUNTER — Telehealth: Payer: Self-pay

## 2013-01-22 NOTE — Telephone Encounter (Signed)
The last time I had this happen, pharmacy advised to change to 500 bid, (with regular Metformin 1,000) but unsure if okay with the XR, insurance would probably not cover. Please advise.

## 2013-01-22 NOTE — Telephone Encounter (Signed)
This is a complex situation because he needs to be on his metformin. Is he taking this with a meal? I know he has been on the 500 mg tabs previously; however did he tolerate those better than the XR tabs? We can certainly change him back to the 500 mg tabs just one bid, this is not ideal obviously, but better than nothing. Perhaps our best option is to discuss this further at his next DM follow up since he is due within the next week or two.

## 2013-01-22 NOTE — Telephone Encounter (Signed)
PT STATES HE WAS PUT ON A DIABETIC PILL AND IT IS TO LARGE, HARD TO SWALLOW AND ALSO IT UPSETS HIS STOMACH. HE IS PRETTY SURE IT IS THE DIABETIC MEDICINE INSTEAD OF HIS ACID REFLUX MEDS. PLEASE CALL [phone] AND ADVISE

## 2013-01-22 NOTE — Telephone Encounter (Signed)
Left message for him to call back.  

## 2013-01-25 NOTE — Telephone Encounter (Signed)
Called patient. Left another message for him to call back.

## 2013-01-25 NOTE — Telephone Encounter (Signed)
I have advised patient to take this with his largest meal, which should be breakfast or lunch. He will try this. He has been taking with crackers only

## 2013-01-26 ENCOUNTER — Other Ambulatory Visit: Payer: Self-pay | Admitting: Family Medicine

## 2013-02-09 ENCOUNTER — Telehealth: Payer: Self-pay

## 2013-02-09 MED ORDER — METFORMIN HCL ER (MOD) 500 MG PO TB24: 500.0000 mg | ORAL_TABLET | Freq: Every day | ORAL | Status: DC

## 2013-02-09 MED ORDER — METFORMIN HCL 500 MG PO TABS: 500.0000 mg | ORAL_TABLET | Freq: Two times a day (BID) | ORAL | Status: DC

## 2013-02-09 NOTE — Telephone Encounter (Signed)
Patient states he is having trouble with the Glumetza upsetting his stomach, he wants this to be changed, he also states if we continue him on Glumetza it will need to be precerted with his insurance, please advise

## 2013-02-09 NOTE — Telephone Encounter (Signed)
D/c glumetza and try another form Meds ordered this encounter  Medications  . metFORMIN (GLUMETZA) 500 MG (MOD) 24 hr tablet    Sig: Take 1 tablet (500 mg total) by mouth daily with breakfast.    Dispense:  60 tablet    Refill:  2  cancelled above Meds ordered this encounter  Medications  . DISCONTD: metFORMIN (GLUMETZA) 500 MG (MOD) 24 hr tablet    Sig: Take 1 tablet (500 mg total) by mouth daily with breakfast.    Dispense:  60 tablet    Refill:  2  . metFORMIN (GLUCOPHAGE) 500 MG tablet    Sig: Take 1 tablet (500 mg total) by mouth 2 (two) times daily with a meal.    Dispense:  180 tablet    Refill:  3   He needs repeat hgb A1C this next 4 weeks

## 2013-02-09 NOTE — Telephone Encounter (Signed)
Left message for him to call me back.  

## 2013-02-09 NOTE — Telephone Encounter (Signed)
PT WOULD LIKE TO SPEAK WITH A NURSE REGARDING HIS MEDS AND HE DIDN'T WANT TO GO INTO DETAILS WITH ME PLEASE CALL [phone]

## 2013-02-10 NOTE — Telephone Encounter (Signed)
Patient advised.

## 2013-02-15 ENCOUNTER — Telehealth: Payer: Self-pay

## 2013-02-15 NOTE — Telephone Encounter (Signed)
Patient would like to talk to a nurse about his medications that is all the info he would tell me [phone]

## 2013-02-15 NOTE — Telephone Encounter (Signed)
Left message for return call.

## 2013-02-17 NOTE — Telephone Encounter (Signed)
Left another message, I have spoken to him previously regarding his Metformin.

## 2013-06-01 ENCOUNTER — Other Ambulatory Visit: Payer: Self-pay | Admitting: Internal Medicine

## 2013-06-02 NOTE — Telephone Encounter (Signed)
Patient needs office visit/ please call to schedule

## 2013-06-27 ENCOUNTER — Other Ambulatory Visit: Payer: Self-pay | Admitting: Physician Assistant

## 2013-07-07 ENCOUNTER — Other Ambulatory Visit: Payer: Self-pay | Admitting: Emergency Medicine

## 2013-08-12 ENCOUNTER — Ambulatory Visit (INDEPENDENT_AMBULATORY_CARE_PROVIDER_SITE_OTHER): Payer: BC Managed Care – PPO | Admitting: Emergency Medicine

## 2013-08-12 VITALS — BP 120/80 | HR 69 | Temp 98.1°F | Resp 16 | Ht 72.5 in | Wt 232.4 lb

## 2013-08-12 DIAGNOSIS — E785 Hyperlipidemia, unspecified: Secondary | ICD-10-CM

## 2013-08-12 DIAGNOSIS — E119 Type 2 diabetes mellitus without complications: Secondary | ICD-10-CM

## 2013-08-12 DIAGNOSIS — I10 Essential (primary) hypertension: Secondary | ICD-10-CM

## 2013-08-12 LAB — LIPID PANEL
HDL: 31 mg/dL — ABNORMAL LOW (ref >39)
LDL Cholesterol: 43 mg/dL (ref 0–99)
Triglycerides: 54 mg/dL (ref <150)
VLDL: 11 mg/dL (ref 0–40)

## 2013-08-12 LAB — COMPREHENSIVE METABOLIC PANEL
ALT: 24 U/L (ref 0–53)
AST: 19 U/L (ref 0–37)
Albumin: 4.5 g/dL (ref 3.5–5.2)
Alkaline Phosphatase: 87 U/L (ref 39–117)
Calcium: 9.3 mg/dL (ref 8.4–10.5)
Chloride: 106 mEq/L (ref 96–112)
Glucose, Bld: 105 mg/dL — ABNORMAL HIGH (ref 70–99)
Potassium: 3.9 mEq/L (ref 3.5–5.3)
Sodium: 140 mEq/L (ref 135–145)

## 2013-08-12 LAB — POCT CBC
Hemoglobin: 14.9 g/dL (ref 14.1–18.1)
MCH, POC: 31.6 pg — AB (ref 27–31.2)
MCV: 97.9 fL — AB (ref 80–97)
MID (cbc): 0.2 (ref 0–0.9)
RBC: 4.72 M/uL (ref 4.69–6.13)
WBC: 4.6 10*3/uL (ref 4.6–10.2)

## 2013-08-12 LAB — TSH: TSH: 1.979 u[IU]/mL (ref 0.350–4.500)

## 2013-08-12 MED ORDER — METFORMIN HCL 500 MG PO TABS: ORAL_TABLET | ORAL | Status: DC

## 2013-08-12 MED ORDER — LOSARTAN POTASSIUM 50 MG PO TABS: ORAL_TABLET | ORAL | Status: DC

## 2013-08-12 MED ORDER — ATORVASTATIN CALCIUM 20 MG PO TABS: ORAL_TABLET | ORAL | Status: DC

## 2013-08-12 MED ORDER — FUROSEMIDE 40 MG PO TABS: ORAL_TABLET | ORAL | Status: DC

## 2013-08-12 NOTE — Patient Instructions (Signed)
Diabetes, Frequently Asked Questions  WHAT IS DIABETES?  Most of the food we eat is turned into glucose (sugar). Our bodies use it for energy. The pancreas makes a hormone called insulin. It helps glucose get into the cells of our bodies. When you have diabetes, your body either does not make enough insulin or cannot use its own insulin as well as it should. This causes sugars to build up in your blood.  WHAT ARE THE SYMPTOMS OF DIABETES?   Frequent urination.   Excessive thirst.   Unexplained weight loss.   Extreme hunger.   Blurred vision.   Tingling or numbness in hands or feet.   Feeling very tired much of the time.   Dry, itchy skin.   Sores that are slow to heal.   Yeast infections.  WHAT ARE THE TYPES OF DIABETES?  Type 1 Diabetes    About 10% of affected people have this type.   Usually occurs before the age of 30.   Usually occurs in thin to normal weight people.  Type 2 Diabetes   About 90% of affected people have this type.   Usually occurs after the age of 40.   Usually occurs in overweight people.   More likely to have:   A family history of diabetes.   A history of diabetes during pregnancy (gestational diabetes).   High blood pressure.   High cholesterol and triglycerides.  Gestational Diabetes   Occurs in about 4% of pregnancies.   Usually goes away after the baby is born.   More likely to occur in women with:   Family history of diabetes.   Previous gestational diabetes.   Obese.   Over 25 years old.  WHAT IS PRE-DIABETES?  Pre-diabetes means your blood glucose is higher than normal, but lower than the diabetes range. It also means you are at risk of getting type 2 diabetes and heart disease. If you are told you have pre-diabetes, have your blood glucose checked again in 1 to 2 years.  WHAT IS THE TREATMENT FOR DIABETES?   Treatment is aimed at keeping blood glucose near normal levels at all times. Learning how to manage this yourself is important in treating diabetes. Depending on the type of diabetes you have, your treatment will include one or more of the following:   Monitoring your blood glucose.   Meal planning.   Exercise.   Oral medicine (pills) or insulin.  CAN DIABETES BE PREVENTED?  With type 1 diabetes, prevention is more difficult, because the triggers that cause it are not yet known.  With type 2 diabetes, prevention is more likely, with lifestyle changes:   Maintain a healthy weight.   Eat healthy.   Exercise.  IS THERE A CURE FOR DIABETES?  No, there is no cure for diabetes. There is a lot of research going on that is looking for a cure, and progress is being made. Diabetes can be treated and controlled. People with diabetes can manage their diabetes and lead normal, active lives.  SHOULD I BE TESTED FOR DIABETES?  If you are at least 47 years old, you should be tested for diabetes. You should be tested again every 3 years. If you are 45 or older and overweight, you may want to get tested more often. If you are younger than 45, overweight, and have one or more of the following risk factors, you should be tested:   Family history of diabetes.   Inactive lifestyle.   High blood pressure.    WHAT ARE SOME OTHER SOURCES FOR INFORMATION ON DIABETES?  The following organizations may help in your search for more information on diabetes:  National Diabetes Education Program (NDEP)  Internet: http://www.ndep.nih.gov/resources  American Diabetes Association  Internet: http://www.diabetes.org   Juvenile Diabetes Foundation International  Internet: http://www.jdf.org  Document Released: 10/10/2003 Document Revised: 12/30/2011 Document Reviewed: 08/04/2009  [employer] Patient Information 2014 [employer].

## 2013-08-12 NOTE — Progress Notes (Signed)
This chart was scribed for Isaiah Chris, MD by Ardelia Mems, Scribe. This patient was seen in room Room 1   Subjective:    Patient ID: Isaiah Spencer, male    DOB: 01-Mar-1966, 47 y.o.   MRN: [id number]  HPI  HPI Comments: Isaiah Spencer is a 47 y.o. male who presents to [facility] requesting a medication refill.     Past Medical History  Diagnosis Date  . GERD (gastroesophageal reflux disease)   . Hyperlipemia   . Hemorrhoids   . OSA (obstructive sleep apnea)     does not use c pap  . Allergy   . Asthma     AS A CHILD    Review of Systems     Objective:   Physical Exam This is dictated in the following note but essentially HEENT exam is normal neck supple chest clear heart regular rate no murmurs extremity exam reveals normal tone and testing with normal pulses no       Assessment & Plan:  Patient is a diabetic. His meds are refilled. His hemoglobin A1c was one point better than his last check  I personally performed the services described in this documentation, which was scribed in my presence. The recorded information has been reviewed and is accurate.

## 2013-08-12 NOTE — Progress Notes (Signed)
  Subjective:    Patient ID: Isaiah Spencer, male    DOB: 1966-05-05, 47 y.o.   MRN: [id number]  HPI  Pt presents for medication refill for DM. He usually sees Dr Merla Riches. He has been taking his meds regularly. He doesn't check his sugars. He doesn't have any other known medical problems. Denies flu shot. Denies problems with his feet. Has not been to see eye dr.    Review of Systems     Objective:   Physical Exam patient is alert and cooperative. He is not in any distress. Neck is supple. Chest is clear to both auscultation and cardiac exam is regular rate without murmurs or gallops. Abdominal exam reveals an umbilical hernia. There are no masses palpable. Extremity exam reveals pulses to be 2+ filament testing was normal   Results for orders placed in visit on 08/12/13  POCT CBC      Result Value Range   WBC 4.6  4.6 - 10.2 K/uL   Lymph, poc 2.0  0.6 - 3.4   POC LYMPH PERCENT 43.1  10 - 50 %L   MID (cbc) 0.2  0 - 0.9   POC MID % 5.3  0 - 12 %M   POC Granulocyte 2.4  2 - 6.9   Granulocyte percent 51.6  37 - 80 %G   RBC 4.72  4.69 - 6.13 M/uL   Hemoglobin 14.9  14.1 - 18.1 g/dL   HCT, POC 40.9  81.1 - 53.7 %   MCV 97.9 (*) 80 - 97 fL   MCH, POC 31.6 (*) 27 - 31.2 pg   MCHC 32.3  31.8 - 35.4 g/dL   RDW, POC 91.4     Platelet Count, POC 253  142 - 424 K/uL   MPV 9.8  0 - 99.8 fL  GLUCOSE, POCT (MANUAL RESULT ENTRY)      Result Value Range   POC Glucose 127 (*) 70 - 99 mg/dl  POCT GLYCOSYLATED HEMOGLOBIN (HGB A1C)      Result Value Range   Hemoglobin A1C 7.1          Assessment & Plan:  His hemoglobin A1c is 7.1 he needs to work on weight loss diet and exercise. No change in medications at the present time . His hemoglobin A1c is down a point from his last check

## 2013-08-13 LAB — MICROALBUMIN, URINE: Microalb, Ur: 0.84 mg/dL (ref 0.00–1.89)

## 2013-09-04 ENCOUNTER — Other Ambulatory Visit: Payer: Self-pay | Admitting: Emergency Medicine

## 2013-12-27 ENCOUNTER — Emergency Department (HOSPITAL_COMMUNITY): Payer: BC Managed Care – PPO

## 2013-12-27 ENCOUNTER — Emergency Department (HOSPITAL_COMMUNITY)
Admission: EM | Admit: 2013-12-27 | Discharge: 2013-12-27 | Disposition: A | Discharge status: Home / Self Care | Payer: BC Managed Care – PPO | Attending: Emergency Medicine | Admitting: Emergency Medicine

## 2013-12-27 ENCOUNTER — Encounter (HOSPITAL_COMMUNITY): Payer: Self-pay | Admitting: Emergency Medicine

## 2013-12-27 DIAGNOSIS — Z79899 Other long term (current) drug therapy: Secondary | ICD-10-CM | POA: Insufficient documentation

## 2013-12-27 DIAGNOSIS — Z7982 Long term (current) use of aspirin: Secondary | ICD-10-CM | POA: Insufficient documentation

## 2013-12-27 DIAGNOSIS — J3489 Other specified disorders of nose and nasal sinuses: Secondary | ICD-10-CM | POA: Insufficient documentation

## 2013-12-27 DIAGNOSIS — R739 Hyperglycemia, unspecified: Secondary | ICD-10-CM

## 2013-12-27 DIAGNOSIS — R05 Cough: Secondary | ICD-10-CM | POA: Insufficient documentation

## 2013-12-27 DIAGNOSIS — E785 Hyperlipidemia, unspecified: Secondary | ICD-10-CM | POA: Insufficient documentation

## 2013-12-27 DIAGNOSIS — R51 Headache: Secondary | ICD-10-CM | POA: Insufficient documentation

## 2013-12-27 DIAGNOSIS — G4733 Obstructive sleep apnea (adult) (pediatric): Secondary | ICD-10-CM | POA: Insufficient documentation

## 2013-12-27 DIAGNOSIS — R059 Cough, unspecified: Secondary | ICD-10-CM | POA: Insufficient documentation

## 2013-12-27 DIAGNOSIS — R519 Headache, unspecified: Secondary | ICD-10-CM

## 2013-12-27 DIAGNOSIS — Z880 Allergy status to penicillin: Secondary | ICD-10-CM | POA: Insufficient documentation

## 2013-12-27 DIAGNOSIS — R1310 Dysphagia, unspecified: Secondary | ICD-10-CM | POA: Insufficient documentation

## 2013-12-27 DIAGNOSIS — K219 Gastro-esophageal reflux disease without esophagitis: Secondary | ICD-10-CM

## 2013-12-27 DIAGNOSIS — E119 Type 2 diabetes mellitus without complications: Secondary | ICD-10-CM | POA: Insufficient documentation

## 2013-12-27 HISTORY — DX: Type 2 diabetes mellitus without complications: E11.9

## 2013-12-27 LAB — BASIC METABOLIC PANEL
BUN: 15 mg/dL (ref 6–23)
CHLORIDE: 98 meq/L (ref 96–112)
CO2: 23 mEq/L (ref 19–32)
Calcium: 9.4 mg/dL (ref 8.4–10.5)
Creatinine, Ser: 0.9 mg/dL (ref 0.50–1.35)
Glucose, Bld: 251 mg/dL — ABNORMAL HIGH (ref 70–99)
POTASSIUM: 3.9 meq/L (ref 3.7–5.3)
Sodium: 137 mEq/L (ref 137–147)

## 2013-12-27 LAB — CBC
HEMATOCRIT: 40.6 % (ref 39.0–52.0)
Hemoglobin: 14.6 g/dL (ref 13.0–17.0)
MCH: 31.5 pg (ref 26.0–34.0)
MCHC: 36 g/dL (ref 30.0–36.0)
MCV: 87.7 fL (ref 78.0–100.0)
Platelets: 195 10*3/uL (ref 150–400)
RBC: 4.63 MIL/uL (ref 4.22–5.81)
RDW: 12.5 % (ref 11.5–15.5)
WBC: 6.8 10*3/uL (ref 4.0–10.5)

## 2013-12-27 LAB — I-STAT TROPONIN, ED: Troponin i, poc: 0 ng/mL (ref 0.00–0.08)

## 2013-12-27 LAB — PRO B NATRIURETIC PEPTIDE: PRO B NATRI PEPTIDE: 50.9 pg/mL (ref 0–125)

## 2013-12-27 MED ORDER — TRIAMCINOLONE ACETONIDE 55 MCG/ACT NA AERO: 2.0000 | INHALATION_SPRAY | Freq: Every day | NASAL | Status: DC

## 2013-12-27 MED ORDER — IBUPROFEN 800 MG PO TABS: 800.0000 mg | ORAL_TABLET | Freq: Three times a day (TID) | ORAL | Status: DC

## 2013-12-27 MED ORDER — HYDROCODONE-ACETAMINOPHEN 5-325 MG PO TABS: 1.0000 | ORAL_TABLET | ORAL | Status: DC | PRN

## 2013-12-27 NOTE — Discharge Instructions (Signed)
Hyperglycemia °Hyperglycemia occurs when the glucose (sugar) in your blood is too high. Hyperglycemia can happen for many reasons, but it most often happens to people who do not know they have diabetes or are not managing their diabetes properly.  °CAUSES  °Whether you have diabetes or not, there are other causes of hyperglycemia. Hyperglycemia can occur when you have diabetes, but it can also occur in other situations that you might not be as aware of, such as: °Diabetes °· If you have diabetes and are having problems controlling your blood glucose, hyperglycemia could occur because of some of the following reasons: °· Not following your meal plan. °· Not taking your diabetes medications or not taking it properly. °· Exercising less or doing less activity than you normally do. °· Being sick. °Pre-diabetes °· This cannot be ignored. Before people develop Type 2 diabetes, they almost always have "pre-diabetes." This is when your blood glucose levels are higher than normal, but not yet high enough to be diagnosed as diabetes. Research has shown that some long-term damage to the body, especially the heart and circulatory system, may already be occurring during pre-diabetes. If you take action to manage your blood glucose when you have pre-diabetes, you may delay or prevent Type 2 diabetes from developing. °Stress °· If you have diabetes, you may be "diet" controlled or on oral medications or insulin to control your diabetes. However, you may find that your blood glucose is higher than usual in the hospital whether you have diabetes or not. This is often referred to as "stress hyperglycemia." Stress can elevate your blood glucose. This happens because of hormones put out by the body during times of stress. If stress has been the cause of your high blood glucose, it can be followed regularly by your caregiver. That way he/she can make sure your hyperglycemia does not continue to get worse or progress to  diabetes. °Steroids °· Steroids are medications that act on the infection fighting system (immune system) to block inflammation or infection. One side effect can be a rise in blood glucose. Most people can produce enough extra insulin to allow for this rise, but for those who cannot, steroids make blood glucose levels go even higher. It is not unusual for steroid treatments to "uncover" diabetes that is developing. It is not always possible to determine if the hyperglycemia will go away after the steroids are stopped. A special blood test called an A1c is sometimes done to determine if your blood glucose was elevated before the steroids were started. °SYMPTOMS °· Thirsty. °· Frequent urination. °· Dry mouth. °· Blurred vision. °· Tired or fatigue. °· Weakness. °· Sleepy. °· Tingling in feet or leg. °DIAGNOSIS  °Diagnosis is made by monitoring blood glucose in one or all of the following ways: °· A1c test. This is a chemical found in your blood. °· Fingerstick blood glucose monitoring. °· Laboratory results. °TREATMENT  °First, knowing the cause of the hyperglycemia is important before the hyperglycemia can be treated. Treatment may include, but is not be limited to: °· Education. °· Change or adjustment in medications. °· Change or adjustment in meal plan. °· Treatment for an illness, infection, etc. °· More frequent blood glucose monitoring. °· Change in exercise plan. °· Decreasing or stopping steroids. °· Lifestyle changes. °HOME CARE INSTRUCTIONS  °· Test your blood glucose as directed. °· Exercise regularly. Your caregiver will give you instructions about exercise. Pre-diabetes or diabetes which comes on with stress is helped by exercising. °· Eat wholesome,   balanced meals. Eat often and at regular, fixed times. Your caregiver or nutritionist will give you a meal plan to guide your sugar intake.  Being at an ideal weight is important. If needed, losing as little as 10 to 15 pounds may help improve blood  glucose levels. SEEK MEDICAL CARE IF:   You have questions about medicine, activity, or diet.  You continue to have symptoms (problems such as increased thirst, urination, or weight gain). SEEK IMMEDIATE MEDICAL CARE IF:   You are vomiting or have diarrhea.  Your breath smells fruity.  You are breathing faster or slower.  You are very sleepy or incoherent.  You have numbness, tingling, or pain in your feet or hands.  You have chest pain.  Your symptoms get worse even though you have been following your caregiver's orders.  If you have any other questions or concerns. Document Released: 04/02/2001 Document Revised: 12/30/2011 Document Reviewed: 02/03/2012 [facility] Patient Information 2014 [place], [place]. Sinus Headache A sinus headache is when your sinuses become clogged or swollen. Sinus headaches can range from mild to severe.  CAUSES A sinus headache can have different causes, such as:  Colds.  Sinus infections.  Allergies. SYMPTOMS  Symptoms of a sinus headache may vary and can include:  Headache.  Pain or pressure in the face.  Congested or runny nose.  Fever.  Inability to smell.  Pain in upper teeth. Weather changes can make symptoms worse. TREATMENT  The treatment of a sinus headache depends on the cause.  Sinus pain caused by a sinus infection may be treated with antibiotic medicine.  Sinus pain caused by allergies may be helped by allergy medicines (antihistamines) and medicated nasal sprays.  Sinus pain caused by congestion may be helped by flushing the nose and sinuses with saline solution. HOME CARE INSTRUCTIONS   If antibiotics are prescribed, take them as directed. Finish them even if you start to feel better.  Only take over-the-counter or prescription medicines for pain, discomfort, or fever as directed by your caregiver.  If you have congestion, use a nasal spray to help reduce pressure. SEEK IMMEDIATE MEDICAL CARE IF:  You have a  fever.  You have headaches more than once a week.  You have sensitivity to light or sound.  You have repeated nausea and vomiting.  You have vision problems.  You have sudden, severe pain in your face or head.  You have a seizure.  You are confused.  Your sinus headaches do not get better after treatment. Many people think they have a sinus headache when they actually have migraines or tension headaches. MAKE SURE YOU:   Understand these instructions.  Will watch your condition.  Will get help right away if you are not doing well or get worse. Document Released: 11/14/2004 Document Revised: 12/30/2011 Document Reviewed: 01/05/2011 [facility] Patient Information 2014 [place], [place].

## 2013-12-27 NOTE — ED Notes (Signed)
Pt states that he has chest pain that have been going on for 2-3 months and thought related to his acid reflux.  Pt also c/o headache that causing sensitivity to light and the light causes blurred vision.  Pt states the chest pain will be on left side then radiate to right and then middle chest. Pt states he also has pain that will shoot down his right hip down his leg. Pt has dry cough that started yesterday.

## 2013-12-27 NOTE — ED Provider Notes (Signed)
Medical screening examination/treatment/procedure(s) were performed by non-physician practitioner and as supervising physician I was immediately available for consultation/collaboration.  Flint MelterElliott L Christopher Hink, MD 12/27/13 [phone]

## 2013-12-27 NOTE — ED Provider Notes (Signed)
CSN: [id number]     Arrival date & time 12/27/13  1215 History   First MD Initiated Contact with Patient 12/27/13 1336     Chief Complaint  Patient presents with  . Chest Pain  . Headache     (Consider location/radiation/quality/duration/timing/severity/associated sxs/prior Treatment) Patient is a 48 y.o. male presenting with chest pain and headaches. The history is provided by the patient. No language interpreter was used.  Chest Pain Pain location:  Substernal area Pain quality: aching   Pain radiates to the back: no   Pain severity:  Mild Duration:  2 days Associated symptoms: cough, dysphagia and headache   Associated symptoms: no abdominal pain, no fever, no nausea, no shortness of breath and not vomiting   Associated symptoms comment:  Sharp central chest pain that has been occuring for the past 2 months, worse since yesterday, that is intermittent, lasting 15-20 seconds then resolving. He has a non-productive cough and a sense of fullness in his throat that sometimes makes it hard to swallow. He is currently taking Prilosec twice daily. He also reports a significant headache in the frontal scalp and facial area. No fever, significant sinus congestion or sore throat. He has had "sinus problems" before. He has not taken anything for his symptoms.   Headache Associated symptoms: cough   Associated symptoms: no abdominal pain, no fever, no nausea, no sinus pressure, no sore throat and no vomiting     Past Medical History  Diagnosis Date  . GERD (gastroesophageal reflux disease)   . Hyperlipemia   . Hemorrhoids   . OSA (obstructive sleep apnea)     does not use c pap  . Allergy   . Asthma     AS A CHILD  . Diabetes mellitus without complication    Past Surgical History  Procedure Laterality Date  . Superficial lymph node biopsy / excision  90's    non cancerous  . Hemorrhoid surgery  11/21/2011    Procedure: HEMORRHOIDECTOMY;  Surgeon: Atilano Ina, MD;  Location: [facility] OR;   Service: General;  Laterality: N/A;  Excisional hemorrhoidectomy times two  . Examination under anesthesia  11/21/2011    Procedure: EXAM UNDER ANESTHESIA;  Surgeon: Atilano Ina, MD;  Location: [facility] OR;  Service: General;  Laterality: N/A;   Family History  Problem Relation Age of Onset  . Heart disease Mother 48    heart attack   . Diabetes Mother   . Hypertension Mother   . Colon cancer Neg Hx   . Rectal cancer Neg Hx   . Stomach cancer Neg Hx   . Esophageal cancer Neg Hx    History  Substance Use Topics  . Smoking status: Never Smoker   . Smokeless tobacco: Never Used  . Alcohol Use: Yes     Comment: socially    Review of Systems  Constitutional: Negative for fever.  HENT: Positive for trouble swallowing. Negative for sinus pressure and sore throat.   Respiratory: Positive for cough. Negative for shortness of breath.   Cardiovascular: Positive for chest pain.  Gastrointestinal: Negative for nausea, vomiting and abdominal pain.  Neurological: Positive for headaches.      Allergies  Amoxicillin-pot clavulanate and Penicillins  Home Medications   Current Outpatient Rx  Name  Route  Sig  Dispense  Refill  . aspirin 325 MG tablet   Oral   Take 325 mg by mouth daily.         Marland Kitchen atorvastatin (LIPITOR) 20 MG tablet  Oral   Take 20 mg by mouth daily at 6 PM.         . furosemide (LASIX) 40 MG tablet   Oral   Take 40 mg by mouth daily.         Marland Kitchen losartan (COZAAR) 50 MG tablet   Oral   Take 50 mg by mouth daily.         . metFORMIN (GLUCOPHAGE) 500 MG tablet   Oral   Take by mouth 2 (two) times daily with a meal.         . Misc Natural Products (SINUS FORMULA PO)   Oral   Take 1 tablet by mouth every 6 (six) hours as needed (nasal congestion).         Marland Kitchen omeprazole (PRILOSEC) 40 MG capsule   Oral   Take 40 mg by mouth daily.         Marland Kitchen EXPIRED: esomeprazole (NEXIUM) 40 MG capsule   Oral   Take 1 capsule (40 mg total) by mouth daily.   90  capsule   3    BP 133/83  Pulse 115  Temp(Src) 100.6 F (38.1 C) (Oral)  Resp 19  SpO2 96% Physical Exam  Constitutional: He is oriented to person, place, and time. He appears well-developed and well-nourished.  HENT:  Head: Normocephalic.  Right Ear: External ear normal.  Left Ear: External ear normal.  Mouth/Throat: Oropharynx is clear and moist.  No reproducible sinus tenderness. Moderate nasal mucosal edema without redness.   Eyes: Conjunctivae are normal.  Neck: Normal range of motion. Neck supple.  Cardiovascular: Normal rate and regular rhythm.   Pulmonary/Chest: Effort normal and breath sounds normal. He has no wheezes. He has no rales. He exhibits tenderness.  Abdominal: Soft. Bowel sounds are normal. There is no tenderness. There is no rebound and no guarding.  Musculoskeletal: Normal range of motion. He exhibits no edema.  Lymphadenopathy:    He has no cervical adenopathy.  Neurological: He is alert and oriented to person, place, and time.  Skin: Skin is warm and dry. No rash noted.  Psychiatric: He has a normal mood and affect.    ED Course  Procedures (including critical care time) Labs Review Labs Reviewed  BASIC METABOLIC PANEL - Abnormal; Notable for the following:    Glucose, Bld 251 (*)    All other components within normal limits  CBC  PRO B NATRIURETIC PEPTIDE  I-STAT TROPOININ, ED   Results for orders placed during the hospital encounter of 12/27/13  CBC      Result Value Ref Range   WBC 6.8  4.0 - 10.5 K/uL   RBC 4.63  4.22 - 5.81 MIL/uL   Hemoglobin 14.6  13.0 - 17.0 g/dL   HCT 16.1  09.6 - 04.5 %   MCV 87.7  78.0 - 100.0 fL   MCH 31.5  26.0 - 34.0 pg   MCHC 36.0  30.0 - 36.0 g/dL   RDW 40.9  81.1 - 91.4 %   Platelets 195  150 - 400 K/uL  BASIC METABOLIC PANEL      Result Value Ref Range   Sodium 137  137 - 147 mEq/L   Potassium 3.9  3.7 - 5.3 mEq/L   Chloride 98  96 - 112 mEq/L   CO2 23  19 - 32 mEq/L   Glucose, Bld 251 (*) 70 - 99  mg/dL   BUN 15  6 - 23 mg/dL   Creatinine, Ser 7.82  0.50 - 1.35 mg/dL   Calcium 9.4  8.4 - 40.910.5 mg/dL   GFR calc non Af Amer >90  >90 mL/min   GFR calc Af Amer >90  >90 mL/min  PRO B NATRIURETIC PEPTIDE      Result Value Ref Range   Pro B Natriuretic peptide (BNP) 50.9  0 - 125 pg/mL  I-STAT TROPOININ, ED      Result Value Ref Range   Troponin i, poc 0.00  0.00 - 0.08 ng/mL   Comment 3            Ct Head Wo Contrast  12/27/2013   CLINICAL DATA:  Sudden onset headache this morning  EXAM: CT HEAD WITHOUT CONTRAST  TECHNIQUE: Contiguous axial images were obtained from the base of the skull through the vertex without intravenous contrast.  COMPARISON:  None  FINDINGS: Normal ventricular morphology.  No midline shift or mass effect.  Normal appearance of brain parenchyma.  No intracranial hemorrhage, mass lesion or evidence acute infarction.  No extra-axial fluid collections.  Low-lying cerebellar tonsils, extending into foramen magnum.  Bones and sinuses normal appearance.  IMPRESSION: No acute intracranial abnormalities.  Low-lying cerebellar tonsils extending into foramen magnum.   Electronically Signed   By: Ulyses SouthwardMark  Boles M.D.   On: 12/27/2013 15:22   Dg Chest Port 1 View  12/27/2013   CLINICAL DATA:  Chest pain  EXAM: PORTABLE CHEST - 1 VIEW  COMPARISON:  None.  FINDINGS: The heart size and mediastinal contours are within normal limits. There is no focal infiltrate, pulmonary edema, or pleural effusion. There is minimal linear atelectasis of left lung base. The visualized skeletal structures are unremarkable.  IMPRESSION: No active cardiopulmonary disease.   Electronically Signed   By: Sherian ReinWei-Chen  Lin M.D.   On: 12/27/2013 13:33   Imaging Review Dg Chest Port 1 View  12/27/2013   CLINICAL DATA:  Chest pain  EXAM: PORTABLE CHEST - 1 VIEW  COMPARISON:  None.  FINDINGS: The heart size and mediastinal contours are within normal limits. There is no focal infiltrate, pulmonary edema, or pleural effusion.  There is minimal linear atelectasis of left lung base. The visualized skeletal structures are unremarkable.  IMPRESSION: No active cardiopulmonary disease.   Electronically Signed   By: Sherian ReinWei-Chen  Lin M.D.   On: 12/27/2013 13:33     EKG Interpretation   Date/Time:  Monday December 27 2013 12:31:42 EDT Ventricular Rate:  115 PR Interval:  194 QRS Duration: 92 QT Interval:  319 QTC Calculation: 441 R Axis:   72 Text Interpretation:  Sinus tachycardia Minimal ST elevation, inferior  leads Abnormal ekg No old tracing to compare Confirmed by [facility]WENT[facility]  MD,  ELLIOTT [phone]([id number]) on 12/27/2013 1:39:53 PM      MDM   Final diagnoses:  None    1. Sinus headache 2. Hyperglycemia 3. GERD  Chest pain is central, associated with globus and non-productive cough, for prolonged period (months). Doubt ACS, favor GERD. Headache pain follows sinus pattern, presents without neurologic deficits, nausea or vomiting, with negative CT. Feel he is stable for discharge home. Encouraged PCP follow up for multiple symptoms.     Arnoldo HookerShari A Lyam Provencio, PA-C 12/27/13 1539

## 2013-12-29 ENCOUNTER — Ambulatory Visit (INDEPENDENT_AMBULATORY_CARE_PROVIDER_SITE_OTHER): Payer: BC Managed Care – PPO | Admitting: Family Medicine

## 2013-12-29 VITALS — BP 112/68 | HR 102 | Temp 99.7°F | Resp 16 | Ht 72.5 in | Wt 230.0 lb

## 2013-12-29 DIAGNOSIS — R600 Localized edema: Secondary | ICD-10-CM

## 2013-12-29 DIAGNOSIS — E119 Type 2 diabetes mellitus without complications: Secondary | ICD-10-CM

## 2013-12-29 DIAGNOSIS — K219 Gastro-esophageal reflux disease without esophagitis: Secondary | ICD-10-CM

## 2013-12-29 DIAGNOSIS — R509 Fever, unspecified: Secondary | ICD-10-CM

## 2013-12-29 DIAGNOSIS — R059 Cough, unspecified: Secondary | ICD-10-CM

## 2013-12-29 DIAGNOSIS — R079 Chest pain, unspecified: Secondary | ICD-10-CM

## 2013-12-29 DIAGNOSIS — R05 Cough: Secondary | ICD-10-CM

## 2013-12-29 DIAGNOSIS — R945 Abnormal results of liver function studies: Secondary | ICD-10-CM

## 2013-12-29 DIAGNOSIS — J101 Influenza due to other identified influenza virus with other respiratory manifestations: Secondary | ICD-10-CM

## 2013-12-29 DIAGNOSIS — R609 Edema, unspecified: Secondary | ICD-10-CM

## 2013-12-29 DIAGNOSIS — J111 Influenza due to unidentified influenza virus with other respiratory manifestations: Secondary | ICD-10-CM

## 2013-12-29 DIAGNOSIS — I10 Essential (primary) hypertension: Secondary | ICD-10-CM

## 2013-12-29 DIAGNOSIS — R7989 Other specified abnormal findings of blood chemistry: Secondary | ICD-10-CM

## 2013-12-29 LAB — POCT CBC
GRANULOCYTE PERCENT: 70.9 % (ref 37–80)
HCT, POC: 47.3 % (ref 43.5–53.7)
HEMOGLOBIN: 15.7 g/dL (ref 14.1–18.1)
Lymph, poc: 1.3 (ref 0.6–3.4)
MCH, POC: 31.5 pg — AB (ref 27–31.2)
MCHC: 33.2 g/dL (ref 31.8–35.4)
MCV: 94.8 fL (ref 80–97)
MID (CBC): 0.4 (ref 0–0.9)
MPV: 11.1 fL (ref 0–99.8)
PLATELET COUNT, POC: 200 10*3/uL (ref 142–424)
POC Granulocyte: 4 (ref 2–6.9)
POC LYMPH PERCENT: 22.8 %L (ref 10–50)
POC MID %: 6.3 %M (ref 0–12)
RBC: 4.99 M/uL (ref 4.69–6.13)
RDW, POC: 12.9 %
WBC: 5.7 10*3/uL (ref 4.6–10.2)

## 2013-12-29 LAB — COMPREHENSIVE METABOLIC PANEL
ALK PHOS: 87 U/L (ref 39–117)
ALT: 90 U/L — ABNORMAL HIGH (ref 0–53)
AST: 88 U/L — AB (ref 0–37)
Albumin: 4.2 g/dL (ref 3.5–5.2)
BUN: 16 mg/dL (ref 6–23)
CO2: 22 mEq/L (ref 19–32)
CREATININE: 1.03 mg/dL (ref 0.50–1.35)
Calcium: 9.1 mg/dL (ref 8.4–10.5)
Chloride: 98 mEq/L (ref 96–112)
Glucose, Bld: 236 mg/dL — ABNORMAL HIGH (ref 70–99)
Potassium: 4.4 mEq/L (ref 3.5–5.3)
Sodium: 137 mEq/L (ref 135–145)
Total Bilirubin: 0.7 mg/dL (ref 0.2–1.2)
Total Protein: 7.5 g/dL (ref 6.0–8.3)

## 2013-12-29 LAB — POCT INFLUENZA A/B
INFLUENZA B, POC: POSITIVE
Influenza A, POC: NEGATIVE

## 2013-12-29 LAB — POCT GLYCOSYLATED HEMOGLOBIN (HGB A1C): HEMOGLOBIN A1C: 10

## 2013-12-29 LAB — GLUCOSE, POCT (MANUAL RESULT ENTRY): POC GLUCOSE: 269 mg/dL — AB (ref 70–99)

## 2013-12-29 MED ORDER — HYDROCOD POLST-CHLORPHEN POLST 10-8 MG/5ML PO LQCR: 5.0000 mL | Freq: Two times a day (BID) | ORAL | Status: DC | PRN

## 2013-12-29 MED ORDER — OSELTAMIVIR PHOSPHATE 75 MG PO CAPS: 75.0000 mg | ORAL_CAPSULE | Freq: Two times a day (BID) | ORAL | Status: DC

## 2013-12-29 MED ORDER — METFORMIN HCL 500 MG PO TABS: 1000.0000 mg | ORAL_TABLET | Freq: Two times a day (BID) | ORAL | Status: DC

## 2013-12-29 MED ORDER — OMEPRAZOLE 40 MG PO CPDR: 40.0000 mg | DELAYED_RELEASE_CAPSULE | Freq: Two times a day (BID) | ORAL | Status: DC

## 2013-12-29 MED ORDER — LOSARTAN POTASSIUM 50 MG PO TABS: 50.0000 mg | ORAL_TABLET | Freq: Every day | ORAL | Status: DC

## 2013-12-29 MED ORDER — FUROSEMIDE 40 MG PO TABS: 40.0000 mg | ORAL_TABLET | Freq: Every day | ORAL | Status: DC

## 2013-12-29 NOTE — Patient Instructions (Signed)
We are treating you today for the flu with tamiflu (take twice a day for 5 days) and the tussionex cough syrup.  This syrup will make you sleepy- do NOT take when you need to drive and do not combine with the other hydrocodone pain medication from the ER. Let me know if you are not feeling better in the next few days- Sooner if worse.   You may go back to work when you have been without fever for 24 hours.    Your diabetes is not under good control.  We need to increase your metformin to 2 pills (1,000mg ) twice a day.  Increase this by 500 mg a week over the next couple of weeks.   Let's plan to get together and look at your diabetes again in 1 month  Next year be sure to get a flu shot!

## 2013-12-29 NOTE — Progress Notes (Signed)
[facility] [address] [place] [address] [phone]  Date:  12/29/2013   Name:  Isaiah Spencer   DOB:  August 26, 1966   MRN:  [id number]  PCP:  Tonye Pearson, MD    Chief Complaint: Follow-up   History of Present Illness:  Isaiah Spencer is a 48 y.o. very pleasant male patient who presents with the following:  He was in the ED 2 days ago with chest pain and headache.    He had a negative BNP, normal cardiac enzymes, and a CT head which was negative, CXR negative  He was treated with vicodin, nasacort.    Today is Wednesday. Since Sunday he notes that his cough and chest congestion are worse.  The cough causes a headache.  Last night his temp was 103.1, he took tylenol this morning.  He notes sweats, chills, body aches "all over." He has noted diarrhea.   He has chest pains that come and go- feels "uncomfortable."  He has noted this for about 6 months or maybe more.  It is not worse with exercise.  He has not seen a cardiologist or had a stress test ever.  The chest pains have not changed over the recent days.  He may notice it every day, sometimes more often than others.   His mother did die of an MI at age 36, but she was very obese and had many health problems per his report.   Carnel is a non- smoker.  He does have DM.  Admits he has not done a great job with follow-up    Patient Active Problem List   Diagnosis Date Noted  . OSA (obstructive sleep apnea) 11/02/2012  . Diabetes mellitus, type 2 07/30/2012  . Dysphagia, unspecified 12/16/2011  . GERD (gastroesophageal reflux disease) 12/07/2011  . H. pylori infection 12/07/2011  . Internal hemorrhoids 07/10/2011    Past Medical History  Diagnosis Date  . GERD (gastroesophageal reflux disease)   . Hyperlipemia   . Hemorrhoids   . OSA (obstructive sleep apnea)     does not use c pap  . Allergy   . Asthma     AS A CHILD  . Diabetes mellitus without complication     Past Surgical History   Procedure Laterality Date  . Superficial lymph node biopsy / excision  90's    non cancerous  . Hemorrhoid surgery  11/21/2011    Procedure: HEMORRHOIDECTOMY;  Surgeon: Atilano Ina, MD;  Location: [facility] OR;  Service: General;  Laterality: N/A;  Excisional hemorrhoidectomy times two  . Examination under anesthesia  11/21/2011    Procedure: EXAM UNDER ANESTHESIA;  Surgeon: Atilano Ina, MD;  Location: [facility] OR;  Service: General;  Laterality: N/A;    History  Substance Use Topics  . Smoking status: Never Smoker   . Smokeless tobacco: Never Used  . Alcohol Use: Yes     Comment: socially    Family History  Problem Relation Age of Onset  . Heart disease Mother 34    heart attack   . Diabetes Mother   . Hypertension Mother   . Colon cancer Neg Hx   . Rectal cancer Neg Hx   . Stomach cancer Neg Hx   . Esophageal cancer Neg Hx     Allergies  Allergen Reactions  . Amoxicillin-Pot Clavulanate Other (See Comments)    Upsets stomach  . Penicillins Hives    Upper body/chest area    Medication list  has been reviewed and updated.  Current Outpatient Prescriptions on File Prior to Visit  Medication Sig Dispense Refill  . aspirin 325 MG tablet Take 325 mg by mouth daily.      Marland Kitchen. atorvastatin (LIPITOR) 20 MG tablet Take 20 mg by mouth daily at 6 PM.      . furosemide (LASIX) 40 MG tablet Take 40 mg by mouth daily.      Marland Kitchen. HYDROcodone-acetaminophen (NORCO/VICODIN) 5-325 MG per tablet Take 1-2 tablets by mouth every 4 (four) hours as needed.  12 tablet  0  . ibuprofen (ADVIL,MOTRIN) 800 MG tablet Take 1 tablet (800 mg total) by mouth 3 (three) times daily.  21 tablet  0  . losartan (COZAAR) 50 MG tablet Take 50 mg by mouth daily.      . metFORMIN (GLUCOPHAGE) 500 MG tablet Take by mouth 2 (two) times daily with a meal.      . Misc Natural Products (SINUS FORMULA PO) Take 1 tablet by mouth every 6 (six) hours as needed (nasal congestion).      Marland Kitchen. omeprazole (PRILOSEC) 40 MG capsule Take 40 mg  by mouth daily.      Marland Kitchen. triamcinolone (NASACORT AQ) 55 MCG/ACT AERO nasal inhaler Place 2 sprays into the nose daily.  1 Inhaler  12  . esomeprazole (NEXIUM) 40 MG capsule Take 1 capsule (40 mg total) by mouth daily.  90 capsule  3   No current facility-administered medications on file prior to visit.    Review of Systems:  As per HPI- otherwise negative.   Physical Examination: Filed Vitals:   12/29/13 0909  BP: 112/68  Pulse: 102  Temp: 99.7 F (37.6 C)  Resp: 16   Filed Vitals:   12/29/13 0909  Height: 6' 0.5" (1.842 m)  Weight: 230 lb (104.327 kg)   Body mass index is 30.75 kg/(m^2). Ideal Body Weight: Weight in (lb) to have BMI = 25: 186.5  GEN: WDWN, NAD, Non-toxic, A & O x 3, coughing, appears to have the flu.  Large build.  Here today with his wife.   HEENT: Atraumatic, Normocephalic. Neck supple. No masses, No LAD.  Bilateral TM wnl, oropharynx normal.  PEERL,EOMI.   Ears and Nose: No external deformity. CV: RRR, No M/G/R. No JVD. No thrill. No extra heart sounds. PULM: CTA B, no wheezes, crackles, rhonchi. No retractions. No resp. distress. No accessory muscle use. ABD: S, NT, ND, +BS. No rebound. No HSM. EXTR: No c/c/e NEURO Normal gait.  PSYCH: Normally interactive. Conversant. Not depressed or anxious appearing.  Calm demeanor.   EKG: WNL, no acute change from previous EKG in the ED.    Results for orders placed in visit on 12/29/13  POCT CBC      Result Value Ref Range   WBC 5.7  4.6 - 10.2 K/uL   Lymph, poc 1.3  0.6 - 3.4   POC LYMPH PERCENT 22.8  10 - 50 %L   MID (cbc) 0.4  0 - 0.9   POC MID % 6.3  0 - 12 %M   POC Granulocyte 4.0  2 - 6.9   Granulocyte percent 70.9  37 - 80 %G   RBC 4.99  4.69 - 6.13 M/uL   Hemoglobin 15.7  14.1 - 18.1 g/dL   HCT, POC 40.947.3  81.143.5 - 53.7 %   MCV 94.8  80 - 97 fL   MCH, POC 31.5 (*) 27 - 31.2 pg   MCHC 33.2  31.8 - 35.4 g/dL  RDW, POC 12.9     Platelet Count, POC 200  142 - 424 K/uL   MPV 11.1  0 - 99.8 fL   POCT INFLUENZA A/B      Result Value Ref Range   Influenza A, POC Negative     Influenza B, POC Positive    POCT GLYCOSYLATED HEMOGLOBIN (HGB A1C)      Result Value Ref Range   Hemoglobin A1C 10.0    GLUCOSE, POCT (MANUAL RESULT ENTRY)      Result Value Ref Range   POC Glucose 269 (*) 70 - 99 mg/dl    Assessment and Plan: Influenza B - Plan: oseltamivir (TAMIFLU) 75 MG capsule, chlorpheniramine-HYDROcodone (TUSSIONEX PENNKINETIC ER) 10-8 MG/5ML LQCR  Cough - Plan: POCT CBC, POCT Influenza A/B  Fever, unspecified - Plan: POCT CBC, POCT Influenza A/B  Chest pain - Plan: EKG 12-Lead, Ambulatory referral to Cardiology  Type II or unspecified type diabetes mellitus without mention of complication, not stated as uncontrolled - Plan: POCT glycosylated hemoglobin (Hb A1C), POCT glucose (manual entry), metFORMIN (GLUCOPHAGE) 500 MG tablet, Comprehensive metabolic panel  GERD (gastroesophageal reflux disease) - Plan: omeprazole (PRILOSEC) 40 MG capsule  HTN (hypertension) - Plan: losartan (COZAAR) 50 MG tablet  Edema, lower extremity - Plan: furosemide (LASIX) 40 MG tablet  Jawann is here today with illness.  He was at the ED and had a CP rule- out.  However his sx now seem to be due to the flu.  Will treat with tamiflu and tussionex syrup prn.    DM: poor control.  Will work up to 1,000 mg of metformin BID.  Recheck in one month Will refer to cardiology due to his risk factors and atypical CP.  If any change in his CP in the meantime he will seek help  Called in the evening and spoke with his wife Britta Mccreedy- Jaz's LFTs are up.  She reports he does not generally drink much etoh and does not usually take tylenol.   Asked her to make sure he does not use tylenol, and we will plan to recheck his LFT's in a few days.  Will leave an order for a repeat LFTs on his chart.  Looking back his LFTs have always been ok in the past. May be elevated due to acute illness.     His anion gap is  op   Signed Abbe Amsterdam, MD

## 2013-12-31 NOTE — Addendum Note (Signed)
Addended by: Abbe AmsterdamOPLAND, Abhiraj Dozal C on: 12/31/2013 09:46 PM   Modules accepted: Orders

## 2014-01-01 ENCOUNTER — Other Ambulatory Visit (INDEPENDENT_AMBULATORY_CARE_PROVIDER_SITE_OTHER): Payer: BC Managed Care – PPO | Admitting: *Deleted

## 2014-01-01 DIAGNOSIS — R945 Abnormal results of liver function studies: Principal | ICD-10-CM

## 2014-01-01 DIAGNOSIS — R7989 Other specified abnormal findings of blood chemistry: Secondary | ICD-10-CM

## 2014-01-01 NOTE — Progress Notes (Signed)
Pt here for blood draw only  

## 2014-01-02 LAB — COMPLETE METABOLIC PANEL WITH GFR
ALT: 60 U/L — ABNORMAL HIGH (ref 0–53)
AST: 47 U/L — ABNORMAL HIGH (ref 0–37)
Albumin: 3.8 g/dL (ref 3.5–5.2)
Alkaline Phosphatase: 78 U/L (ref 39–117)
BUN: 14 mg/dL (ref 6–23)
CO2: 28 meq/L (ref 19–32)
CREATININE: 0.87 mg/dL (ref 0.50–1.35)
Calcium: 9 mg/dL (ref 8.4–10.5)
Chloride: 102 mEq/L (ref 96–112)
GFR, Est Non African American: >89 mL/min
Glucose, Bld: 212 mg/dL — ABNORMAL HIGH (ref 70–99)
Potassium: 3.9 mEq/L (ref 3.5–5.3)
Sodium: 141 mEq/L (ref 135–145)
Total Bilirubin: 0.6 mg/dL (ref 0.2–1.2)
Total Protein: 7 g/dL (ref 6.0–8.3)

## 2014-01-03 ENCOUNTER — Telehealth: Payer: Self-pay | Admitting: Family Medicine

## 2014-01-03 ENCOUNTER — Other Ambulatory Visit: Payer: Self-pay | Admitting: Family Medicine

## 2014-01-03 ENCOUNTER — Encounter: Payer: Self-pay | Admitting: Family Medicine

## 2014-01-03 DIAGNOSIS — R7989 Other specified abnormal findings of blood chemistry: Secondary | ICD-10-CM

## 2014-01-03 DIAGNOSIS — R945 Abnormal results of liver function studies: Principal | ICD-10-CM

## 2014-01-03 NOTE — Telephone Encounter (Signed)
Called to let him know that his LFTs are better.  Plan to follow-up once again in 2- 4 weeks to ensure they return to normal.

## 2014-01-21 ENCOUNTER — Encounter: Payer: Self-pay | Admitting: Cardiovascular Disease

## 2014-01-21 ENCOUNTER — Ambulatory Visit (INDEPENDENT_AMBULATORY_CARE_PROVIDER_SITE_OTHER): Payer: BC Managed Care – PPO | Admitting: Cardiovascular Disease

## 2014-01-21 VITALS — BP 112/69 | HR 92 | Ht 73.0 in | Wt 230.0 lb

## 2014-01-21 DIAGNOSIS — R609 Edema, unspecified: Secondary | ICD-10-CM

## 2014-01-21 DIAGNOSIS — I10 Essential (primary) hypertension: Secondary | ICD-10-CM

## 2014-01-21 DIAGNOSIS — R5383 Other fatigue: Secondary | ICD-10-CM

## 2014-01-21 DIAGNOSIS — R600 Localized edema: Secondary | ICD-10-CM

## 2014-01-21 DIAGNOSIS — R0789 Other chest pain: Secondary | ICD-10-CM | POA: Insufficient documentation

## 2014-01-21 DIAGNOSIS — R5381 Other malaise: Secondary | ICD-10-CM

## 2014-01-21 MED ORDER — CARVEDILOL 3.125 MG PO TABS: 3.1250 mg | ORAL_TABLET | Freq: Two times a day (BID) | ORAL | Status: DC

## 2014-01-21 NOTE — Patient Instructions (Addendum)
Your physician has requested that you have an echocardiogram. Echocardiography is a painless test that uses sound waves to create images of your heart. It provides your doctor with information about the size and shape of your heart and how well your heart's chambers and valves are working. This procedure takes approximately one hour. There are no restrictions for this procedure.  Your physician recommends that you schedule a follow-up appointment in: 3 months   Your physician has recommended you make the following change in your medication:  START CARVEDILOL 3.125 MG TWICE DAILY 12 HOURS APART// SENT TO [facility]  REDUCE HIGH SODIUM FOODS LIKE CANNED SOUP, GRAVY, SAUCES, READY PREPARED FOODS LIKE FROZEN FOODS; LEAN CUISINE, LASAGNA. BACON, SAUSAGE, LUNCH MEAT, FAST FOODS, HOT DOGS, CHIPS, PIZZA, CHINESE FOOD, MEXICAN FOOD, SOY SAUCE, STORE BOUGHT FRIED CHICKEN= [place] FRIED CHICKEN/ BOJANGLES. OLIVES, PICKLES AND KETCHUP   Your physician discussed the importance of regular exercise and recommended that you start or continue a regular exercise program for good health.

## 2014-01-21 NOTE — Progress Notes (Signed)
Isaiah Spencer Date of Birth  Jun 04, 1966       [facility]    [place] [address]  [address] [place], [place]  [address]   [place], [place]  [address] [phone]     [phone]   Fax  [phone]     Fax [phone]  Problem List: 1. Chest pain 2. Obstructive sleep apnea 3. Type 2 diabetes mellitus  History of Present Illness:  Isaiah Spencer is referred for further evaluation for some vague chest pains.  He has some discomfort on his left side - squeezing/ grabbing  pain  - lasts for several seconds.  Not associated with eating, drinking, exercise, twisting or turning.   He does not smoke.  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  He eats lots of fast food.   He has been diagnosed with sleep apnea but never got his CPAP mask  Current Outpatient Prescriptions on File Prior to Visit  Medication Sig Dispense Refill  . aspirin 325 MG tablet Take 325 mg by mouth daily.      Marland Kitchen atorvastatin (LIPITOR) 20 MG tablet Take 20 mg by mouth daily at 6 PM.      . esomeprazole (NEXIUM) 40 MG capsule Take 1 capsule (40 mg total) by mouth daily.  90 capsule  3  . furosemide (LASIX) 40 MG tablet Take 1 tablet (40 mg total) by mouth daily.  30 tablet  6  . losartan (COZAAR) 50 MG tablet Take 1 tablet (50 mg total) by mouth daily.  30 tablet  6  . metFORMIN (GLUCOPHAGE) 500 MG tablet Take 2 tablets (1,000 mg total) by mouth 2 (two) times daily with a meal.  120 tablet  6  . omeprazole (PRILOSEC) 40 MG capsule Take 1 capsule (40 mg total) by mouth 2 (two) times daily.  60 capsule  3   No current facility-administered medications on file prior to visit.    Allergies  Allergen Reactions  . Amoxicillin-Pot Clavulanate Other (See Comments)    Upsets stomach  . Penicillins Hives    Upper body/chest area    Past Medical History  Diagnosis Date  . GERD  (gastroesophageal reflux disease)   . Hyperlipemia   . Hemorrhoids   . OSA (obstructive sleep apnea)     does not use c pap  . Allergy   . Asthma     AS A CHILD  . Diabetes mellitus without complication   . H. pylori infection     Past Surgical History  Procedure Laterality Date  . Superficial lymph node biopsy / excision  90's    non cancerous  . Hemorrhoid surgery  11/21/2011    Procedure: HEMORRHOIDECTOMY;  Surgeon: Atilano Ina, MD;  Location: [facility] OR;  Service: General;  Laterality: N/A;  Excisional hemorrhoidectomy times two  . Examination under anesthesia  11/21/2011    Procedure: EXAM UNDER ANESTHESIA;  Surgeon: Atilano Ina, MD;  Location: [facility] OR;  Service: General;  Laterality: N/A;    History  Smoking status  . Never Smoker   Smokeless tobacco  . Never Used    History  Alcohol Use  . Yes    Comment: socially    Family History  Problem Relation Age of Onset  . Heart disease Mother 69    heart attack   . Diabetes Mother   .  Hypertension Mother   . Colon cancer Neg Hx   . Rectal cancer Neg Hx   . Stomach cancer Neg Hx   . Esophageal cancer Neg Hx     Reviw of Systems:  Reviewed in the HPI.  All other systems are negative.  Physical Exam: Blood pressure 112/69, pulse 92, height 6\' 1"  (1.854 m), weight 230 lb (104.327 kg). Wt Readings from Last 3 Encounters:  01/21/14 230 lb (104.327 kg)  12/29/13 230 lb (104.327 kg)  08/12/13 232 lb 6.4 oz (105.416 kg)     General: Well developed, well nourished, in no acute distress.  Head: Normocephalic, atraumatic, sclera non-icteric, mucus membranes are moist,   Neck: Supple. Carotids are 2 + without bruits. No JVD   Lungs: Clear   Heart: RR, normal S1S2  Abdomen: Soft, non-tender, non-distended with normal bowel sounds.  Msk:  Strength and tone are normal   Extremities: No clubbing or cyanosis. No edema.  Distal pedal pulses are 2+ and equal    Neuro: CN II - XII intact.  Alert and oriented X 3.    Psych:  Normal   ECG:   Assessment / Plan:

## 2014-01-21 NOTE — Assessment & Plan Note (Signed)
Isaiah Spencer presents today for further evaluation of atypical chest pain. These pains occur at various times and do not sound anginal at all. At some point it may be useful to do a stress test but he has a lot of other issues that need to be evaluated and managed before we proceed with stress testing.  He has sleep apnea but never went to pick up a CPAP mask HeHe falls asleep easily during the day so  he drinks lots of energy drinks caffeinated drinks. This has caused his heart rate to increase slightly.  He also eats a lot of fast foods and as a result probably gets too much salt. He does not exercise on a regular basis.  We will give him some information on low salt diet. I've encouraged him to check back with his medical Dr. to get his CPAP mask. We'll start him on carvedilol 3.125 mg twice a day. I'll try to get an echocardiogram for further evaluation of his LV function. He has a history of hypertension and new stents significant leg edema until he started taking Lasix. He may have some pulmonary hypertension because of his sleep apnea.  I'll see him back in 3 months. At that time if he still having any  chest pain we can consider a stress test.

## 2014-02-02 ENCOUNTER — Encounter: Payer: Self-pay | Admitting: Internal Medicine

## 2014-02-02 ENCOUNTER — Institutional Professional Consult (permissible substitution): Payer: BC Managed Care – PPO | Admitting: Cardiovascular Disease

## 2014-02-02 ENCOUNTER — Ambulatory Visit (HOSPITAL_COMMUNITY): Payer: BC Managed Care – PPO | Attending: Internal Medicine | Admitting: Cardiology

## 2014-02-02 DIAGNOSIS — R5381 Other malaise: Secondary | ICD-10-CM | POA: Insufficient documentation

## 2014-02-02 DIAGNOSIS — R609 Edema, unspecified: Secondary | ICD-10-CM | POA: Insufficient documentation

## 2014-02-02 DIAGNOSIS — R600 Localized edema: Secondary | ICD-10-CM

## 2014-02-02 DIAGNOSIS — R0789 Other chest pain: Secondary | ICD-10-CM

## 2014-02-02 DIAGNOSIS — I10 Essential (primary) hypertension: Secondary | ICD-10-CM

## 2014-02-02 DIAGNOSIS — R5383 Other fatigue: Secondary | ICD-10-CM

## 2014-02-02 NOTE — Progress Notes (Signed)
Echo performed. 

## 2014-02-07 ENCOUNTER — Telehealth: Payer: Self-pay | Admitting: Cardiovascular Disease

## 2014-02-07 NOTE — Telephone Encounter (Signed)
Left message on patient's personal voice mail that echo is normal and to call office for questions/concerns.

## 2014-02-07 NOTE — Telephone Encounter (Signed)
Follow up      Pt returning call for his test results.

## 2014-02-16 ENCOUNTER — Ambulatory Visit (INDEPENDENT_AMBULATORY_CARE_PROVIDER_SITE_OTHER): Payer: BC Managed Care – PPO | Admitting: Family Medicine

## 2014-02-16 VITALS — BP 104/70 | HR 97 | Temp 98.3°F | Ht 71.5 in | Wt 226.6 lb

## 2014-02-16 DIAGNOSIS — Z23 Encounter for immunization: Secondary | ICD-10-CM

## 2014-02-16 DIAGNOSIS — E1165 Type 2 diabetes mellitus with hyperglycemia: Principal | ICD-10-CM

## 2014-02-16 DIAGNOSIS — E7800 Pure hypercholesterolemia, unspecified: Secondary | ICD-10-CM

## 2014-02-16 DIAGNOSIS — IMO0001 Reserved for inherently not codable concepts without codable children: Secondary | ICD-10-CM

## 2014-02-16 DIAGNOSIS — J309 Allergic rhinitis, unspecified: Secondary | ICD-10-CM

## 2014-02-16 LAB — POCT CBC
Granulocyte percent: 56.9 %G (ref 37–80)
HCT, POC: 44.3 % (ref 43.5–53.7)
Hemoglobin: 14.7 g/dL (ref 14.1–18.1)
LYMPH, POC: 1.6 (ref 0.6–3.4)
MCH, POC: 31.6 pg — AB (ref 27–31.2)
MCHC: 33.2 g/dL (ref 31.8–35.4)
MCV: 95.3 fL (ref 80–97)
MID (CBC): 0.4 (ref 0–0.9)
MPV: 9.9 fL (ref 0–99.8)
PLATELET COUNT, POC: 262 10*3/uL (ref 142–424)
POC GRANULOCYTE: 2.7 (ref 2–6.9)
POC LYMPH PERCENT: 34 %L (ref 10–50)
POC MID %: 9.1 % (ref 0–12)
RBC: 4.65 M/uL — AB (ref 4.69–6.13)
RDW, POC: 13.5 %
WBC: 4.8 10*3/uL (ref 4.6–10.2)

## 2014-02-16 LAB — POCT URINALYSIS DIPSTICK
Bilirubin, UA: NEGATIVE
Glucose, UA: NEGATIVE
Leukocytes, UA: NEGATIVE
NITRITE UA: NEGATIVE
PH UA: 6
PROTEIN UA: NEGATIVE
SPEC GRAV UA: 1.025
Urobilinogen, UA: 1

## 2014-02-16 LAB — GLUCOSE, POCT (MANUAL RESULT ENTRY): POC GLUCOSE: 170 mg/dL — AB (ref 70–99)

## 2014-02-16 MED ORDER — AZELASTINE HCL 0.1 % NA SOLN: 2.0000 | Freq: Two times a day (BID) | NASAL | Status: DC

## 2014-02-16 NOTE — Progress Notes (Addendum)
Subjective:    Patient ID: Isaiah Spencer, male    DOB: 03-31-1966, 48 y.o.   MRN: [id number]  HPI This chart was scribed for Vong Garringer-MD, by Ladona Ridgel Day, Scribe. This patient was seen in room 8 and the patient's care was started at 6:56 PM.  HPI Comments: Isaiah Spencer is a 48 y.o. male who [facility] diabetes check. In his last visit 12/29/13, his HGB A1C was 10.0 as well his LFTs were elevated.  He reports that he does not check his sugar levels at home. In his last visit, his dose of metformin was doubled. He states that recently, he stopped taking all of his medicines for an entire week b/c he attributed an upset stomach to his medicines; he has since started his medicines again. In his last visit, he was also referred to cardiologist who started him on a beta blocker. He gets up about once per night to void. He denies any numbness/tingling of his extremities.   Medicines which he is taking: 1. Lasix (he takes this daily). 2. ASA daily. 3. Prilosec. 4. Beta blocker. 5. Lipitor. (recently halved his dose of lipitor since his blood work improved in his last visit.) 6. Claritin daily for seasonal allergies.   He has had chronic nasal congestion and attributes this to his hx of deviated septum. He does not like to use flonase. He does not use Afrin. He has not tried Astelin.   He denies any hx of MI or CVA, denies any major surgeries except a previous hemorrhoid surgery which he reports does not feel he fully healed from and would like to go back to his surgeon. He had childhood asthma.   His mother passed at age of 83, MI, DM and HTN. He has 3 brothers, one of them w/questionable hx of stomach ulcers; no other pertinent medical hx.   He is not a smoker, he is a social drinker.  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   Past Medical History  Diagnosis Date  . GERD (gastroesophageal reflux disease)   . Hyperlipemia   . Hemorrhoids   .  OSA (obstructive sleep apnea)     does not use c pap  . Allergy   . Asthma     AS A CHILD  . Diabetes mellitus without complication   . H. pylori infection    Past Surgical History  Procedure Laterality Date  . Superficial lymph node biopsy / excision  90's    non cancerous  . Hemorrhoid surgery  11/21/2011    Procedure: HEMORRHOIDECTOMY;  Surgeon: Atilano Ina, MD;  Location: [facility] OR;  Service: General;  Laterality: N/A;  Excisional hemorrhoidectomy times two  . Examination under anesthesia  11/21/2011    Procedure: EXAM UNDER ANESTHESIA;  Surgeon: Atilano Ina, MD;  Location: [facility] OR;  Service: General;  Laterality: N/A;     Allergies  Allergen Reactions  . Amoxicillin-Pot Clavulanate Other (See Comments)    Upsets stomach  . Penicillins Hives    Upper body/chest area   History   Social History  . Marital Status: Married    Spouse Name: N/A    Number of Children: 2  . Years of Education: N/A   Occupational History  . DJ    Social History Main Topics  . Smoking status: Never Smoker   . Smokeless tobacco: Never Used  . Alcohol Use: 1.0 oz/week    2  drink(s) per week     Comment: socially  . Drug Use: No  . Sexual Activity: Not on file   Other Topics Concern  . Not on file   Social History Narrative   Exercising:  none   Family History  Problem Relation Age of Onset  . Heart disease Mother 6554    heart attack   . Diabetes Mother   . Hypertension Mother   . Colon cancer Neg Hx   . Rectal cancer Neg Hx   . Stomach cancer Neg Hx   . Esophageal cancer Neg Hx      Review of Systems  Constitutional: Negative for fever and chills.  HENT: Positive for congestion, postnasal drip, rhinorrhea and sinus pressure. Negative for ear pain and sore throat.   Eyes: Negative for photophobia and visual disturbance.  Respiratory: Negative for cough and shortness of breath.   Cardiovascular: Negative for chest pain.  Gastrointestinal: Negative for nausea and vomiting.    Endocrine: Negative for cold intolerance, heat intolerance, polydipsia, polyphagia and polyuria.  Musculoskeletal: Negative for back pain.  Skin: Negative for rash.  Neurological: Negative for weakness and numbness.  All other systems reviewed and are negative.      Objective:   Physical Exam  Nursing note and vitals reviewed. Constitutional: He is oriented to person, place, and time. He appears well-developed and well-nourished. No distress.  HENT:  Head: Normocephalic and atraumatic.  Right Ear: External ear normal.  Left Ear: External ear normal.  Nose: Mucosal edema and rhinorrhea present. Right sinus exhibits no maxillary sinus tenderness and no frontal sinus tenderness. Left sinus exhibits no maxillary sinus tenderness and no frontal sinus tenderness.  Mouth/Throat: Oropharynx is clear and moist.  Eyes: Conjunctivae are normal. Right eye exhibits no discharge. Left eye exhibits no discharge.  Neck: Normal range of motion. Neck supple. No JVD present. No thyromegaly present.  Cardiovascular: Normal rate, regular rhythm, normal heart sounds and intact distal pulses.  Exam reveals no gallop and no friction rub.   No murmur heard. Pulmonary/Chest: Effort normal and breath sounds normal. No respiratory distress. He has no wheezes. He has no rales.  Abdominal: Soft. Bowel sounds are normal. He exhibits no distension. There is no tenderness. There is no rebound and no guarding.  Musculoskeletal: Normal range of motion. He exhibits no edema.  Lymphadenopathy:    He has no cervical adenopathy.  Neurological: He is alert and oriented to person, place, and time.  Skin: Skin is warm and dry. No rash noted. He is not diaphoretic.  Feet without callus formation or lesions.  Psychiatric: He has a normal mood and affect. Thought content normal.    Triage Vitals: BP 104/70  Pulse 97  Temp(Src) 98.3 F (36.8 C) (Oral)  Ht 5' 11.5" (1.816 m)  Wt 226 lb 9.6 oz (102.785 kg)  BMI 31.17 kg/m2   SpO2 97%  Results for orders placed in visit on 02/16/14  POCT CBC      Result Value Ref Range   WBC 4.8  4.6 - 10.2 K/uL   Lymph, poc 1.6  0.6 - 3.4   POC LYMPH PERCENT 34.0  10 - 50 %L   MID (cbc) 0.4  0 - 0.9   POC MID % 9.1  0 - 12 %M   POC Granulocyte 2.7  2 - 6.9   Granulocyte percent 56.9  37 - 80 %G   RBC 4.65 (*) 4.69 - 6.13 M/uL   Hemoglobin 14.7  14.1 - 18.1  g/dL   HCT, POC 16.144.3  09.643.5 - 53.7 %   MCV 95.3  80 - 97 fL   MCH, POC 31.6 (*) 27 - 31.2 pg   MCHC 33.2  31.8 - 35.4 g/dL   RDW, POC 04.513.5     Platelet Count, POC 262  142 - 424 K/uL   MPV 9.9  0 - 99.8 fL  GLUCOSE, POCT (MANUAL RESULT ENTRY)      Result Value Ref Range   POC Glucose 170 (*) 70 - 99 mg/dl  POCT URINALYSIS DIPSTICK      Result Value Ref Range   Color, UA yellow     Clarity, UA clear     Glucose, UA neg     Bilirubin, UA neg     Ketones, UA trace     Spec Grav, UA 1.025     Blood, UA small     pH, UA 6.0     Protein, UA neg     Urobilinogen, UA 1.0     Nitrite, UA neg     Leukocytes, UA Negative     PNEUMOVAX ADMINISTERED.  Ate bacon, egg, toast at 10:00am; drank orange juice, ginger ale.    Assessment & Plan:  Type II or unspecified type diabetes mellitus without mention of complication, uncontrolled - Plan: POCT CBC, POCT glucose (manual entry), POCT urinalysis dipstick, Comprehensive metabolic panel, TSH, Lipid panel, Microalbumin, urine, CANCELED: POCT UA - Microscopic Only  Pure hypercholesterolemia - Plan: POCT CBC, POCT urinalysis dipstick, Comprehensive metabolic panel, TSH, Lipid panel, Microalbumin, urine, CANCELED: POCT UA - Microscopic Only  Allergic rhinitis, cause unspecified - Plan: POCT CBC, POCT urinalysis dipstick, CANCELED: POCT UA - Microscopic Only  1. DMII: uncontrolled; continue Metformin 1000mg  bid after meals. If HgbA1c remains > 7.0 at next visit, warrant second agent.  S/p Pneumovax and monofilament exam. 2.  Hypercholesterolemia: stable; obtain labs;  continue current dose of Lipitor.   3.  Allergic Rhinitis: uncontrolled; rx for Claritin 10mg  daily; rx for Astelin nasal spray provided; intolerant to Flonase.  Meds ordered this encounter  Medications  . loratadine (CLARITIN) 10 MG tablet    Sig: Take 10 mg by mouth daily.  Marland Kitchen. azelastine (ASTELIN) 137 MCG/SPRAY nasal spray    Sig: Place 2 sprays into both nostrils 2 (two) times daily. Use in each nostril as directed    Dispense:  30 mL    Refill:  12    I personally performed the services described in this documentation, which was scribed in my presence. The recorded information has been reviewed and is accurate.    Nilda SimmerKristi Ahni Bradwell, M.D.  [facility]  [place] [address] [place], [place]  [id number] [phone] phone [phone] fax

## 2014-02-17 LAB — COMPREHENSIVE METABOLIC PANEL
ALK PHOS: 99 U/L (ref 39–117)
ALT: 33 U/L (ref 0–53)
AST: 24 U/L (ref 0–37)
Albumin: 4.5 g/dL (ref 3.5–5.2)
BUN: 15 mg/dL (ref 6–23)
CALCIUM: 9.7 mg/dL (ref 8.4–10.5)
CO2: 24 mEq/L (ref 19–32)
CREATININE: 0.99 mg/dL (ref 0.50–1.35)
Chloride: 101 mEq/L (ref 96–112)
GLUCOSE: 168 mg/dL — AB (ref 70–99)
Potassium: 3.8 mEq/L (ref 3.5–5.3)
Sodium: 139 mEq/L (ref 135–145)
Total Bilirubin: 1.1 mg/dL (ref 0.2–1.2)
Total Protein: 7.7 g/dL (ref 6.0–8.3)

## 2014-02-17 LAB — TSH: TSH: 1.732 u[IU]/mL (ref 0.350–4.500)

## 2014-02-17 LAB — LIPID PANEL
Cholesterol: 113 mg/dL (ref 0–200)
HDL: 31 mg/dL — AB (ref >39)
LDL CALC: 40 mg/dL (ref 0–99)
Total CHOL/HDL Ratio: 3.6 Ratio
Triglycerides: 210 mg/dL — ABNORMAL HIGH (ref <150)
VLDL: 42 mg/dL — AB (ref 0–40)

## 2014-02-17 LAB — MICROALBUMIN, URINE: Microalb, Ur: 1.43 mg/dL (ref 0.00–1.89)

## 2014-03-02 NOTE — Progress Notes (Signed)
LMOM to CB. 

## 2014-03-11 NOTE — Progress Notes (Signed)
Left message to call office to schedule follow up visit.

## 2014-07-02 ENCOUNTER — Other Ambulatory Visit: Payer: Self-pay | Admitting: Family Medicine

## 2014-07-17 ENCOUNTER — Ambulatory Visit (INDEPENDENT_AMBULATORY_CARE_PROVIDER_SITE_OTHER): Payer: BC Managed Care – PPO | Admitting: Family Medicine

## 2014-07-17 ENCOUNTER — Telehealth: Payer: Self-pay | Admitting: Family Medicine

## 2014-07-17 VITALS — BP 111/78 | HR 88 | Temp 97.5°F | Resp 16 | Ht 71.5 in | Wt 226.2 lb

## 2014-07-17 DIAGNOSIS — E785 Hyperlipidemia, unspecified: Secondary | ICD-10-CM

## 2014-07-17 DIAGNOSIS — R5381 Other malaise: Secondary | ICD-10-CM

## 2014-07-17 DIAGNOSIS — R5383 Other fatigue: Secondary | ICD-10-CM

## 2014-07-17 DIAGNOSIS — Z79899 Other long term (current) drug therapy: Secondary | ICD-10-CM

## 2014-07-17 DIAGNOSIS — I10 Essential (primary) hypertension: Secondary | ICD-10-CM

## 2014-07-17 DIAGNOSIS — IMO0001 Reserved for inherently not codable concepts without codable children: Secondary | ICD-10-CM

## 2014-07-17 DIAGNOSIS — R609 Edema, unspecified: Secondary | ICD-10-CM

## 2014-07-17 DIAGNOSIS — G4733 Obstructive sleep apnea (adult) (pediatric): Secondary | ICD-10-CM

## 2014-07-17 DIAGNOSIS — R5382 Chronic fatigue, unspecified: Secondary | ICD-10-CM

## 2014-07-17 DIAGNOSIS — E1165 Type 2 diabetes mellitus with hyperglycemia: Principal | ICD-10-CM

## 2014-07-17 DIAGNOSIS — R600 Localized edema: Secondary | ICD-10-CM

## 2014-07-17 LAB — COMPREHENSIVE METABOLIC PANEL
ALBUMIN: 4.3 g/dL (ref 3.5–5.2)
ALT: 43 U/L (ref 0–53)
AST: 27 U/L (ref 0–37)
Alkaline Phosphatase: 81 U/L (ref 39–117)
BUN: 10 mg/dL (ref 6–23)
CALCIUM: 9.5 mg/dL (ref 8.4–10.5)
CHLORIDE: 103 meq/L (ref 96–112)
CO2: 27 mEq/L (ref 19–32)
Creat: 1.02 mg/dL (ref 0.50–1.35)
Glucose, Bld: 160 mg/dL — ABNORMAL HIGH (ref 70–99)
POTASSIUM: 3.9 meq/L (ref 3.5–5.3)
SODIUM: 139 meq/L (ref 135–145)
TOTAL PROTEIN: 7.6 g/dL (ref 6.0–8.3)
Total Bilirubin: 1.4 mg/dL — ABNORMAL HIGH (ref 0.2–1.2)

## 2014-07-17 LAB — LIPID PANEL
Cholesterol: 93 mg/dL (ref 0–200)
HDL: 36 mg/dL — ABNORMAL LOW (ref >39)
LDL Cholesterol: 35 mg/dL (ref 0–99)
Total CHOL/HDL Ratio: 2.6 Ratio
Triglycerides: 110 mg/dL (ref <150)
VLDL: 22 mg/dL (ref 0–40)

## 2014-07-17 LAB — POCT GLYCOSYLATED HEMOGLOBIN (HGB A1C): Hemoglobin A1C: 7.9

## 2014-07-17 LAB — GLUCOSE, POCT (MANUAL RESULT ENTRY): POC Glucose: 167 mg/dl — AB (ref 70–99)

## 2014-07-17 MED ORDER — ATORVASTATIN CALCIUM 20 MG PO TABS: 20.0000 mg | ORAL_TABLET | Freq: Every day | ORAL | Status: DC

## 2014-07-17 MED ORDER — GLUCOSE BLOOD VI STRP: ORAL_STRIP | Status: DC

## 2014-07-17 MED ORDER — LOSARTAN POTASSIUM 25 MG PO TABS: 25.0000 mg | ORAL_TABLET | Freq: Every day | ORAL | Status: DC

## 2014-07-17 MED ORDER — FREESTYLE LANCETS MISC: Status: DC

## 2014-07-17 MED ORDER — FREESTYLE SYSTEM KIT: 1.0000 | PACK | Status: DC | PRN

## 2014-07-17 MED ORDER — OMEPRAZOLE 40 MG PO CPDR: 40.0000 mg | DELAYED_RELEASE_CAPSULE | Freq: Two times a day (BID) | ORAL | Status: DC

## 2014-07-17 MED ORDER — FUROSEMIDE 40 MG PO TABS: 40.0000 mg | ORAL_TABLET | Freq: Every day | ORAL | Status: DC

## 2014-07-17 MED ORDER — CARVEDILOL 3.125 MG PO TABS: 3.1250 mg | ORAL_TABLET | Freq: Two times a day (BID) | ORAL | Status: DC

## 2014-07-17 MED ORDER — GLIPIZIDE ER 10 MG PO TB24: 10.0000 mg | ORAL_TABLET | Freq: Every day | ORAL | Status: DC

## 2014-07-17 NOTE — Telephone Encounter (Signed)
Per DR Clelia Croft: Please call [facility] to see if he is a patient there. If so, he will need to have a CPAP machine ordered.

## 2014-07-17 NOTE — Patient Instructions (Signed)
Diabetes and Standards of Medical Care Diabetes is complicated. You may find that your diabetes team includes a dietitian, nurse, diabetes educator, eye doctor, and more. To help everyone know what is going on and to help you get the care you deserve, the following schedule of care was developed to help keep you on track. Below are the tests, exams, vaccines, medicines, education, and plans you will need. HbA1c test This test shows how well you have controlled your glucose over the past 2-3 months. It is used to see if your diabetes management plan needs to be adjusted.   It is performed at least 2 times a year if you are meeting treatment goals.  It is performed 4 times a year if therapy has changed or if you are not meeting treatment goals. Blood pressure test  This test is performed at every routine medical visit. The goal is less than 140/90 mm Hg for most people, but 130/80 mm Hg in some cases. Ask your health care provider about your goal. Dental exam  Follow up with the dentist regularly. Eye exam  If you are diagnosed with type 1 diabetes as a child, get an exam upon reaching the age of 37 years or older and have had diabetes for 3-5 years. Yearly eye exams are recommended after that initial eye exam.  If you are diagnosed with type 1 diabetes as an adult, get an exam within 5 years of diagnosis and then yearly.  If you are diagnosed with type 2 diabetes, get an exam as soon as possible after the diagnosis and then yearly. Foot care exam  Visual foot exams are performed at every routine medical visit. The exams check for cuts, injuries, or other problems with the feet.  A comprehensive foot exam should be done yearly. This includes visual inspection as well as assessing foot pulses and testing for loss of sensation.  Check your feet nightly for cuts, injuries, or other problems with your feet. Tell your health care provider if anything is not healing. Kidney function test (urine  microalbumin)  This test is performed once a year.  Type 1 diabetes: The first test is performed 5 years after diagnosis.  Type 2 diabetes: The first test is performed at the time of diagnosis.  A serum creatinine and estimated glomerular filtration rate (eGFR) test is done once a year to assess the level of chronic kidney disease (CKD), if present. Lipid profile (cholesterol, HDL, LDL, triglycerides)  Performed every 5 years for most people.  The goal for LDL is less than 100 mg/dL. If you are at high risk, the goal is less than 70 mg/dL.  The goal for HDL is 40 mg/dL-50 mg/dL for men and 50 mg/dL-60 mg/dL for women. An HDL cholesterol of 60 mg/dL or higher gives some protection against heart disease.  The goal for triglycerides is less than 150 mg/dL. Influenza vaccine, pneumococcal vaccine, and hepatitis B vaccine  The influenza vaccine is recommended yearly.  It is recommended that people with diabetes who are over 24 years old get the pneumonia vaccine. In some cases, two separate shots may be given. Ask your health care provider if your pneumonia vaccination is up to date.  The hepatitis B vaccine is also recommended for adults with diabetes. Diabetes self-management education  Education is recommended at diagnosis and ongoing as needed. Treatment plan  Your treatment plan is reviewed at every medical visit. Document Released: 08/04/2009 Document Revised: 02/21/2014 Document Reviewed: 03/09/2013 [facility] Patient Information 2015 [place],  [employer]. This information is not intended to replace advice given to you by your health care provider. Make sure you discuss any questions you have with your health care provider. Diabetes and Exercise Diabetes mellitus is a common, chronic disease, in which the pancreas is unable to adequately control blood glucose (sugar) levels. There are 2 types of diabetes. Type 1 diabetes patients are unable to produce insulin, a hormone that causes sugar  in the blood to be stored in the body. People with type 1 diabetes may compensate by giving themselves injections of insulin. Type 2 diabetes involves not producing adequate amounts of insulin to control blood glucose levels. People with type 2 diabetes control their blood glucose by monitoring their food intake or by taking medicine. Exercise is an important part of diabetes treatment. During exercise, the muscles use a greater amount of glucose from the blood for energy. This lowers your blood glucose, which is the same effect you would get from taking insulin. It has been shown that endurance athletes are more sensitive to insulin than inactive people. SYMPTOMS  Many people with a mild case of diabetes have no symptoms. However, if left uncontrolled, diabetes can lead to several complications that could be prevented with treatment of the disease. General symptoms of diabetes include:   Frequent urination (polyuria).  Frequent thirst and drinking (polydipsia).  Increased food consumption (polyphagia).  Fatigue.  Poor exercise performance.  Blurred vision.  Inflammation of the vagina (vaginitis) caused by fungal infections.  Skin infections (uncommon).  Numbness in the feet, caused by nerve injury.  Kidney disease. CAUSES  The cause of most cases of diabetes is unknown. In children, diabetes is often due to an autoimmune response to the cells in the pancreas that make insulin. It is also linked with other diseases, such as cystic fibrosis. Diabetes may have a genetic link. PREVENTION  Athletes should strive to begin exercise with blood glucose in a well-controlled state.  Feet should always be kept clean and dry.  Activities in which low blood sugar levels cannot be treated easily (scuba diving, rock climbing, swimming) should be avoided.  Anticipate alterations in diet or training to avoid low blood sugar (hypoglycemia) and high blood sugar (hyperglycemia).  Athletes should try  to increase sugar consumption after strenuous exercise to avoid hypoglycemia.  Short-acting insulin should not be injected into an actively exercising muscle. The athlete should rest the injection site for about 1 hour after exercise.  Patients with diabetes should get routine checkups of the feet to prevent complications. PROGNOSIS  Exercise provides many benefits to the person with diabetes:   Reduced body fat.  Lower blood pressure.  Often, reduced need for medicines.  Improved exercise tolerance.  Lower insulin levels.  Weight loss.  Improved lipid profile (decreased cholesterol and low-density lipoproteins). RELATED COMPLICATIONS  If performed incorrectly, exercise can result in complications of diabetes:   Poor control of blood sugar, when exercise is performed at the wrong time.  Increase in renal disease, from loss of body fluids (dehydration).  Increased risk of nerve injury (neuropathy) when performing exercises that increase foot injury.  Increased risk of eye problems when performing activities that involve breath holding or lowering or jarring the head.  Increased risk of sudden death from exercise in patients with heart disease.  Worsening of hypertension with heavy lifting (more than 10 lb/4.5 kg). Altered blood glucose and insulin dose as a result of mild illness that produces loss of appetite.  Altered uptake of insulin after  injection when insulin injection site is changed. NOTE: Exercise can lower blood glucose effectively, but the effects are short-lasting (no more than a couple of days). Exercise has been shown to improve your sensitivity to insulin. This may alter how your body responds to a given dose of injected insulin. It is important for every patient with diabetes to know how his or her body may react to exercise, and to adjust insulin dosages accordingly. TREATMENT  Eat about 1 to 3 hours before exercise.  Check blood glucose immediately before  and after exercise.  Stop exercise if blood glucose is more than 250 mg/dL.  Stop exercise if blood glucose is less than 100 mg/dL.  Do not exercise within 1 hour of an insulin injection.  Be prepared to treat low blood glucose while exercising. Keep some sugar product with you, such as a candy bar.  For prolonged exercise, use a sports drink to maintain your glucose level.  Replace used-up glucose in the body after exercise.  Consume fluids during and after exercise to avoid dehydration. SEEK MEDICAL CARE IF:  You have vision changes after a run.  You notice a loss of sensation in your feet after exercise.  You have increased numbness, tingling, or pins and needles sensations after exercise.  You have chest pain during or after exercise.  You have a fast, irregular heartbeat (palpitations) during or after exercise.  Your exercise tolerance gets worse.  You have fainting or dizzy spells for brief periods during or after exercise. Document Released: 10/07/2005 Document Revised: 02/21/2014 Document Reviewed: 01/19/2009 [facility] Patient Information 2015 [place], [place]. This information is not intended to replace advice given to you by your health care provider. Make sure you discuss any questions you have with your health care provider. Diabetes Mellitus and Food It is important for you to manage your blood sugar (glucose) level. Your blood glucose level can be greatly affected by what you eat. Eating healthier foods in the appropriate amounts throughout the day at about the same time each day will help you control your blood glucose level. It can also help slow or prevent worsening of your diabetes mellitus. Healthy eating may even help you improve the level of your blood pressure and reach or maintain a healthy weight.  HOW CAN FOOD AFFECT ME? Carbohydrates Carbohydrates affect your blood glucose level more than any other type of food. Your dietitian will help you determine how  many carbohydrates to eat at each meal and teach you how to count carbohydrates. Counting carbohydrates is important to keep your blood glucose at a healthy level, especially if you are using insulin or taking certain medicines for diabetes mellitus. Alcohol Alcohol can cause sudden decreases in blood glucose (hypoglycemia), especially if you use insulin or take certain medicines for diabetes mellitus. Hypoglycemia can be a life-threatening condition. Symptoms of hypoglycemia (sleepiness, dizziness, and disorientation) are similar to symptoms of having too much alcohol.  If your health care provider has given you approval to drink alcohol, do so in moderation and use the following guidelines:  Women should not have more than one drink per day, and men should not have more than two drinks per day. One drink is equal to:  12 oz of beer.  5 oz of wine.  1 oz of hard liquor.  Do not drink on an empty stomach.  Keep yourself hydrated. Have water, diet soda, or unsweetened iced tea.  Regular soda, juice, and other mixers might contain a lot of carbohydrates and should  be counted. WHAT FOODS ARE NOT RECOMMENDED? As you make food choices, it is important to remember that all foods are not the same. Some foods have fewer nutrients per serving than other foods, even though they might have the same number of calories or carbohydrates. It is difficult to get your body what it needs when you eat foods with fewer nutrients. Examples of foods that you should avoid that are high in calories and carbohydrates but low in nutrients include:  Trans fats (most processed foods list trans fats on the Nutrition Facts label).  Regular soda.  Juice.  Candy.  Sweets, such as cake, pie, doughnuts, and cookies.  Fried foods. WHAT FOODS CAN I EAT? Have nutrient-rich foods, which will nourish your body and keep you healthy. The food you should eat also will depend on several factors, including:  The calories  you need.  The medicines you take.  Your weight.  Your blood glucose level.  Your blood pressure level.  Your cholesterol level. You also should eat a variety of foods, including:  Protein, such as meat, poultry, fish, tofu, nuts, and seeds (lean animal proteins are best).  Fruits.  Vegetables.  Dairy products, such as milk, cheese, and yogurt (low fat is best).  Breads, grains, pasta, cereal, rice, and beans.  Fats such as olive oil, trans fat-free margarine, canola oil, avocado, and olives. DOES EVERYONE WITH DIABETES MELLITUS HAVE THE SAME MEAL PLAN? Because every person with diabetes mellitus is different, there is not one meal plan that works for everyone. It is very important that you meet with a dietitian who will help you create a meal plan that is just right for you. Document Released: 07/04/2005 Document Revised: 10/12/2013 Document Reviewed: 09/03/2013 [facility] Patient Information 2015 [place], [place]. This information is not intended to replace advice given to you by your health care provider. Make sure you discuss any questions you have with your health care provider. Blood Glucose Monitoring Monitoring your blood glucose (also know as blood sugar) helps you to manage your diabetes. It also helps you and your health care provider monitor your diabetes and determine how well your treatment plan is working. WHY SHOULD YOU MONITOR YOUR BLOOD GLUCOSE?  It can help you understand how food, exercise, and medicine affect your blood glucose.  It allows you to know what your blood glucose is at any given moment. You can quickly tell if you are having low blood glucose (hypoglycemia) or high blood glucose (hyperglycemia).  It can help you and your health care provider know how to adjust your medicines.  It can help you understand how to manage an illness or adjust medicine for exercise. WHEN SHOULD YOU TEST? Your health care provider will help you decide how often you  should check your blood glucose. This may depend on the type of diabetes you have, your diabetes control, or the types of medicines you are taking. Be sure to write down all of your blood glucose readings so that this information can be reviewed with your health care provider. See below for examples of testing times that your health care provider may suggest. Type 1 Diabetes  Test 4 times a day if you are in good control, using an insulin pump, or perform multiple daily injections.  If your diabetes is not well controlled or if you are sick, you may need to monitor more often.  It is a good idea to also monitor:  Before and after exercise.  Between meals and 2 hours  after a meal.  Occasionally between 2:00 a.m. and 3:00 a.m. Type 2 Diabetes  It can vary with each person, but generally, if you are on insulin, test 4 times a day.  If you take medicines by mouth (orally), test 2 times a day.  If you are on a controlled diet, test once a day.  If your diabetes is not well controlled or if you are sick, you may need to monitor more often. HOW TO MONITOR YOUR BLOOD GLUCOSE Supplies Needed  Blood glucose meter.  Test strips for your meter. Each meter has its own strips. You must use the strips that go with your own meter.  A pricking needle (lancet).  A device that holds the lancet (lancing device).  A journal or log book to write down your results. Procedure  Wash your hands with soap and water. Alcohol is not preferred.  Prick the side of your finger (not the tip) with the lancet.  Gently milk the finger until a small drop of blood appears.  Follow the instructions that come with your meter for inserting the test strip, applying blood to the strip, and using your blood glucose meter. Other Areas to Get Blood for Testing Some meters allow you to use other areas of your body (other than your finger) to test your blood. These areas are called alternative sites. The most common  alternative sites are:  The forearm.  The thigh.  The back area of the lower leg.  The palm of the hand. The blood flow in these areas is slower. Therefore, the blood glucose values you get may be delayed, and the numbers are different from what you would get from your fingers. Do not use alternative sites if you think you are having hypoglycemia. Your reading will not be accurate. Always use a finger if you are having hypoglycemia. Also, if you cannot feel your lows (hypoglycemia unawareness), always use your fingers for your blood glucose checks. ADDITIONAL TIPS FOR GLUCOSE MONITORING  Do not reuse lancets.  Always carry your supplies with you.  All blood glucose meters have a 24-hour "hotline" number to call if you have questions or need help.  Adjust (calibrate) your blood glucose meter with a control solution after finishing a few boxes of strips. BLOOD GLUCOSE RECORD KEEPING It is a good idea to keep a daily record or log of your blood glucose readings. Most glucose meters, if not all, keep your glucose records stored in the meter. Some meters come with the ability to download your records to your home computer. Keeping a record of your blood glucose readings is especially helpful if you are wanting to look for patterns. Make notes to go along with the blood glucose readings because you might forget what happened at that exact time. Keeping good records helps you and your health care provider to work together to achieve good diabetes management.  Document Released: 10/10/2003 Document Revised: 02/21/2014 Document Reviewed: 03/01/2013 [facility] Patient Information 2015 [place], [place]. This information is not intended to replace advice given to you by your health care provider. Make sure you discuss any questions you have with your health care provider. How to Avoid Diabetes Problems You can do a lot to prevent or slow down diabetes problems. Following your diabetes plan and taking care  of yourself can reduce your risk of serious or life-threatening complications. Below, you will find certain things you can do to prevent diabetes problems. MANAGE YOUR DIABETES Follow your health care provider's, nurse educator's,  and dietitian's instructions for managing your diabetes. They will teach you the basics of diabetes care. They can help answer questions you may have. Learn about diabetes and make healthy choices regarding eating and physical activity. Monitor your blood glucose level regularly. Your health care provider will help you decide how often to check your blood glucose level depending on your treatment goals and how well you are meeting them.  DO NOT USE NICOTINE Nicotine and diabetes are a dangerous combination. Nicotine raises your risk for diabetes problems. If you quit using nicotine, you will lower your risk for heart attack, stroke, nerve disease, and kidney disease. Your cholesterol and your blood pressure levels may improve. Your blood circulation will also improve. Do not use any tobacco products, including cigarettes, chewing tobacco, or electronic cigarettes. If you need help quitting, ask your health care provider. KEEP YOUR BLOOD PRESSURE UNDER CONTROL Keeping your blood pressure under control will help prevent damage to your eyes, kidneys, heart, and blood vessels. Blood pressure consists of two numbers. The top number should be below 120, and the bottom number should be below 80 (120/80). Keep your blood pressure as close to these numbers as you can. If you already have kidney disease, you may want even lower blood pressure to protect your kidneys. Talk to your health care provider to make sure that your blood pressure goal is right for your needs. Meal planning, medicines, and exercise can help you reach your blood pressure target. Have your blood pressure checked at every visit with your health care provider. KEEP YOUR CHOLESTEROL UNDER CONTROL Normal cholesterol levels  will help prevent heart disease and stroke. These are the biggest health problems for people with diabetes. Keeping cholesterol levels under control can also help with blood flow. Have your cholesterol level checked at least once a year. Your health care provider may prescribe a medicine known as a statin. Statins lower your cholesterol. If you are not taking a statin, ask your health care provider if you should be. Meal planning, exercise, and medicines can help you reach your cholesterol targets.  SCHEDULE AND KEEP YOUR ANNUAL PHYSICAL EXAMS AND EYE EXAMS Your health care provider will tell you how often he or she wants to see you depending on your plan of treatment. It is important that you keep these appointments so that possible problems can be identified early and complications can be avoided or treated.  Every visit with your health care provider should include your weight, blood pressure, and an evaluation of your blood glucose control.  Your hemoglobin A1c should be checked:  At least twice a year if you are at your goal.  Every 3 months if there are changes in treatment.  If you are not meeting your goals.  Your blood lipids should be checked yearly. You should also be checked yearly to see if you have protein in your urine (microalbumin).  Schedule a dilated eye exam within 5 years of your diagnosis if you have type 1 diabetes, and then yearly. Schedule a dilated eye exam at diagnosis if you have type 2 diabetes, and then yearly. All exams thereafter can be extended to every 2 to 3 years if one or more exams have been normal. KEEP YOUR VACCINES CURRENT The flu vaccine is recommended yearly. The formula for the vaccine changes every year and needs to be updated for the best protection against current viruses. It is recommended that people with diabetes who are over 31 years old get the pneumonia vaccine.  In some cases, two separate shots may be given. Ask your health care provider if your  pneumonia vaccination is up-to-date. However, there are some instances where another vaccine is recommended. Check with your health care provider. TAKE CARE OF YOUR FEET  Diabetes may cause you to have a poor blood supply (circulation) to your legs and feet. Because of this, the skin may be thinner, break easier, and heal more slowly. You also may have nerve damage in your legs and feet, causing decreased feeling. You may not notice minor injuries to your feet that could lead to serious problems or infections. Taking care of your feet is very important. Visual foot exams are performed at every routine medical visit. The exams check for cuts, injuries, or other problems with the feet. A comprehensive foot exam should be done yearly. This includes visual inspection as well as assessing foot pulses and testing for loss of sensation. You should also do the following:  Inspect your feet daily for cuts, calluses, blisters, ingrown toenails, and signs of infection, such as redness, swelling, or pus.  Wash and dry your feet thoroughly, especially between the toes.  Avoid soaking your feet regularly in hot water baths.  Moisturize dry skin with lotion, avoiding areas between your toes.  Cut toenails straight across and file the edges.  Avoid shoes that do not fit well or have areas that irritate your skin.  Avoid going barefooted or wearing only socks. Your feet need protection. TAKE CARE OF YOUR TEETH People with poorly controlled diabetes are more likely to have gum (periodontal) disease. These infections make diabetes harder to control. Periodontal diseases, if left untreated, can lead to tooth loss. Brush your teeth twice a day, floss, and see your dentist for checkups and cleaning every 6 months, or 2 times a year. ASK YOUR HEALTH CARE PROVIDER ABOUT TAKING ASPIRIN Taking aspirin daily is recommended to help prevent cardiovascular disease in people with and without diabetes. Ask your health care  provider if this would benefit you and what dose he or she would recommend. DRINK RESPONSIBLY Moderate amounts of alcohol (less than 1 drink per day for adult women and less than 2 drinks per day for adult men) have a minimal effect on blood glucose if ingested with food. It is important to eat food with alcohol to avoid hypoglycemia. People should avoid alcohol if they have a history of alcohol abuse or dependence, if they are pregnant, and if they have liver disease, pancreatitis, advanced neuropathy, or severe hypertriglyceridemia. LESSEN STRESS Living with diabetes can be stressful. When you are under stress, your blood glucose may be affected in two ways:  Stress hormones may cause your blood glucose to rise.  You may be distracted from taking good care of yourself. It is a good idea to be aware of your stress level and make changes that are necessary to help you better manage challenging situations. Support groups, planned relaxation, a hobby you enjoy, meditation, healthy relationships, and exercise all work to lower your stress level. If your efforts do not seem to be helping, get help from your health care provider or a trained mental health professional. Document Released: 06/25/2011 Document Revised: 02/21/2014 Document Reviewed: 12/01/2013 [facility] Patient Information 2015 [place], [place]. This information is not intended to replace advice given to you by your health care provider. Make sure you discuss any questions you have with your health care provider. Diabetes, Eating Away From Home Sometimes, you might eat in a restaurant or have meals  that are prepared by someone else. You can enjoy eating out. However, the portions in restaurants may be much larger than needed. Listed below are some ideas to help you choose foods that will keep your blood glucose (sugar) in better control.  TIPS FOR EATING OUT  Know your meal plan and how many carbohydrate servings you should have at each meal.  You may wish to carry a copy of your meal plan in your purse or wallet. Learn the foods included in each food group.  Make a list of restaurants near you that offer healthy choices. Take a copy of the carry-out menus to see what they offer. Then, you can plan what you will order ahead of time.  Become familiar with serving sizes by practicing them at home using measuring cups and spoons. Once you learn to recognize portion sizes, you will be able to correctly estimate the amount of total carbohydrate you are allowed to eat at the restaurant. Ask for a takeout box if the portion is more than you should have. When your food comes, leave the amount you should have on the plate, and put the rest in the takeout box before you start eating.  Plan ahead if your mealtime will be different from usual. Check with your caregiver to find out how to time meals and medicine if you are taking insulin.  Avoid high-fat foods, such as fried foods, cream sauces, high-fat salad dressings, or any added butter or margarine.  Do not be afraid to ask questions. Ask your server about the portion size, cooking methods, ingredients and if items can be substituted. Restaurants do not list all available items on the menu. You can ask for your main entree to be prepared using skim milk, oil instead of butter or margarine, and without gravy or sauces. Ask your waiter or waitress to serve salad dressings, gravy, sauces, margarine, and sour cream on the side. You can then add the amount your meal plan suggests.  Add more vegetables whenever possible.  Avoid items that are labeled "jumbo," "giant," "deluxe," or "supersized."  You may want to split an entre with someone and order an extra side salad.  Watch for hidden calories in foods like croutons, bacon, or cheese.  Ask your server to take away the bread basket or chips from your table.  Order a dinner salad as an appetizer. You can eat most foods served in a restaurant.  Some foods are better choices than others. Breads and Starches  Recommended: All kinds of bread (wheat, rye, white, oatmeal, [place], [place], raisin), hard or soft dinner rolls, frankfurter or hamburger buns, small bagels, small corn or whole-wheat flour tortillas.  Avoid: Frosted or glazed breads, butter rolls, egg or cheese breads, croissants, sweet rolls, pastries, coffee cake, glazed or frosted doughnuts, muffins. Crackers  Recommended: Animal crackers, graham, rye, saltine, oyster, and matzoth crackers. Bread sticks, melba toast, rusks, pretzels, popcorn (without fat), zwieback toast.  Avoid: High-fat snack crackers or chips. Buttered popcorn. Cereals  Recommended: Hot and cold cereals. Whole grains such as oatmeal or shredded wheat are good choices.  Avoid: Sugar-coated or granola type cereals. Potatoes/Pasta/Rice/Beans  Recommended: Order baked, boiled, or mashed potatoes, rice or noodles without added fat, whole beans. Order gravies, butter, margarine, or sauces on the side so you can control the amount you add.  Avoid: Hash browns or fried potatoes. Potatoes, pasta, or rice prepared with cream or cheese sauce. Potato or pasta salads prepared with large amounts of dressing. Fried beans  or fried rice. Vegetables  Recommended: Order steamed, baked, boiled, or stewed vegetables without sauces or extra fat. Ask that sauce be served on the side. If vegetables are not listed on the menu, ask what is available.  Avoid: Vegetables prepared with cream, butter, or cheese sauce. Fried vegetables. Salad Bars  Recommended: Many of the vegetables at a salad bar are considered "free." Use lemon juice, vinegar, or low-calorie salad dressing (fewer than 20 calories per serving) as "free" dressings for your salad. Look for salad bar ingredients that have no added fat or sugar such as tomatoes, lettuce, cucumbers, broccoli, carrots, onions, and mushrooms.  Avoid: Prepared salads with large amounts  of dressing, such as coleslaw, caesar salad, macaroni salad, bean salad, or carrot salad. Fruit  Recommended: Eat fresh fruit or fresh fruit salad without added dressing. A salad bar often offers fresh fruit choices, but canned fruit at a restaurant is usually packed in sugar or syrup.  Avoid: Sweetened canned or frozen fruits, plain or sweetened fruit juice. Fruit salads with dressing, sour cream, or sugar added to them. Meat and Meat Substitutes  Recommended: Order broiled, baked, roasted, or grilled meat, poultry, or fish. Trim off all visible fat. Do not eat the skin of poultry. The size stated on the menu is the raw weight. Meat shrinks by  in cooking (for example, 4 oz raw equals 3 oz cooked meat).  Avoid: Deep-fat fried meat, poultry, or fish. Breaded meats. Eggs  Recommended: Order soft, hard-cooked, poached, or scrambled eggs. Omelets may be okay, depending on what ingredients are added. Egg substitutes are also a good choice.  Avoid: Fried eggs, eggs prepared with cream or cheese sauce. Milk  Recommended: Order low-fat or fat-free milk according to your meal plan. Plain, nonfat yogurt or flavored yogurt with no sugar added may be used as a substitute for milk. Soy milk may also be used.  Avoid: Milk shakes or sweetened milk beverages. Soups and Combination Foods  Recommended: Clear broth or consomm are "free" foods and may be used as an appetizer. Broth-based soups with fat removed count as a starch serving and are preferred over cream soups. Soups made with beans or split peas may be eaten but count as a starch.  Avoid: Fatty soups, soup made with cream, cheese soup. Combination foods prepared with excessive amounts of fat or with cream or cheese sauces. Desserts and Sweets  Recommended: Ask for fresh fruit. Sponge or angel food cake without icing, ice milk, no sugar added ice cream, sherbet, or frozen yogurt may fit into your meal plan occasionally.  Avoid: Pastries,  puddings, pies, cakes with icing, custard, gelatin desserts. Fats and Oils  Recommended: Choose healthy fats such as olive oil, canola oil, or tub margarine, reduced fat or fat-free sour cream, cream cheese, avocado, or nuts.  Avoid: Any fats in excess of your allowed portion. Deep-fried foods or any food with a large amount of fat. Note: Ask for all fats to be served on the side, and limit your portion sizes according to your meal plan. Document Released: 10/07/2005 Document Revised: 12/30/2011 Document Reviewed: 01/04/2014 [facility] Patient Information 2015 [place], [place]. This information is not intended to replace advice given to you by your health care provider. Make sure you discuss any questions you have with your health care provider. Complementary and Alternative Medical Therapies for Diabetes Complementary and alternative medicines are health care practices or products that are not always accepted as part of routine medicine. Complementary medicine is used along  with routine medicine (medical therapy). Alternative medicine can sometimes be used instead of routine medicine. Some people use these methods to treat diabetes. While some of these therapies may be effective, others may not be. Some may even be harmful. Patients using these methods need to tell their caregiver. It is important to let your caregivers know what you are doing. Some of these therapies are discussed below. For more information, talk with your caregiver. THERAPIES Acupuncture Acupuncture is done by a professional who inserts needles into certain points on the skin. Some scientists believe that this triggers the release of the body's natural painkillers. It has been shown to relieve long-term (chronic) pain. This may help patients with painful nerve damage caused by diabetes. Biofeedback Biofeedback helps a person become more aware of the body's response to pain. It also helps you learn to deal with the pain. This  alternative therapy focuses on relaxation and stress-reduction techniques. Thinking of peaceful mental images (guided imagery) is one technique. Some people believe these images can ease their condition. MEDICATIONS Chromium Several studies report that chromium supplements may improve diabetes control. Chromium helps insulin improve its action. Research is not yet certain. Supplements have not been recommended or approved. Caution is needed if you have kidney (renal) problems. Ginseng There are several types of ginseng plants. American ginseng is used for diabetes studies. Those studies have shown some glucose-lowering effects. Those effects have been seen with fasting and after-meal blood glucose levels. They have also been seen in A1c levels (average blood glucose levels over a 26-month period). More long-term studies are needed before recommendations for use of ginseng can be made. Magnesium Experts have studied the relationship between magnesium and diabetes for many years. But it is not yet fully understood. Studies suggest that a low amount of magnesium may make blood glucose control worse in type 2 diabetes. Research also shows that a low amount may contribute to certain diabetes complications. One study showed that people who consume more magnesium had less risk of type 2 diabetes. Eating whole grains, nuts, and green leafy vegetables raises the magnesium level. Vanadium Vanadium is a compound found in tiny amounts in plants and animals. Early studies showed that vanadium improved blood glucose levels in animals with type 1 and type 2 diabetes. One study found that when given vanadium, those with diabetes were able to decrease their insulin dosage. Researchers still need to learn how it works in the body to discover any side effects, and to find safe dosages. Cinnamon There have been a couple of studies that seem to indicate cinnamon decreases insulin resistance and increases insulin production. By  doing so, it may lower blood glucose. Exact doses are unknown, but it may work best when used in combination with other diabetes medicines. Document Released: 08/04/2007 Document Revised: 12/30/2011 Document Reviewed: 08/17/2009 [facility] Patient Information 2015 [place], [place]. This information is not intended to replace advice given to you by your health care provider. Make sure you discuss any questions you have with your health care provider.

## 2014-07-17 NOTE — Progress Notes (Addendum)
Subjective:    Patient ID: Isaiah Spencer, male    DOB: Mar 22, 1966, 48 y.o.   MRN: [id number] Chief Complaint  Patient presents with  . Diabetes    here for a diabetes check    HPI   Here today for med refill w/ his wife.  Taking metformin $RemoveBeforeDE'1000mg'[id number]$  qam - started on medication about a yr ago.  Was on metformin XR initially but due to expense and side effects of nausea was transitioned to bid metformin IR 1000.  Still makes him really nauseas and tired. Often forgets to take evening dose but no prob compling w/ am dose.   Has not been to DM ed but would like some diet info printed or a website he could use. Having occ numbness in hands and swelling in feet. On asa, no optho eval - has noticed some vision changes. Does not have glucometer at home. No hypoglycemic episodes.  Past Medical History  Diagnosis Date  . GERD (gastroesophageal reflux disease)   . Hyperlipemia   . Hemorrhoids   . OSA (obstructive sleep apnea)     does not use c pap  . Allergy   . Asthma     AS A CHILD  . Diabetes mellitus without complication   . H. pylori infection    Current Outpatient Prescriptions on File Prior to Visit  Medication Sig Dispense Refill  . aspirin 325 MG tablet Take 325 mg by mouth daily.       No current facility-administered medications on file prior to visit.   Allergies  Allergen Reactions  . Amoxicillin-Pot Clavulanate Other (See Comments)    Upsets stomach  . Penicillins Hives    Upper body/chest area     Review of Systems  Constitutional: Positive for fatigue. Negative for fever, chills, activity change, appetite change and unexpected weight change.  Eyes: Positive for visual disturbance.  Respiratory: Negative for shortness of breath.   Cardiovascular: Positive for leg swelling. Negative for chest pain and palpitations.  Gastrointestinal: Positive for nausea. Negative for vomiting, diarrhea and constipation.  Neurological: Positive for numbness and headaches. Negative  for dizziness, syncope, facial asymmetry, weakness and light-headedness.       Objective:  BP 108/68  Pulse 90  Temp(Src) 97.5 F (36.4 C) (Oral)  Resp 16  Ht 5' 11.5" (1.816 m)  Wt 226 lb 3.2 oz (102.604 kg)  BMI 31.11 kg/m2  SpO2 98%  Physical Exam  Constitutional: He is oriented to person, place, and time. He appears well-developed and well-nourished. No distress.  HENT:  Head: Normocephalic and atraumatic.  Eyes: Conjunctivae are normal. Pupils are equal, round, and reactive to light. No scleral icterus.  Neck: Normal range of motion. Neck supple. No thyromegaly present.  Cardiovascular: Normal rate, regular rhythm, normal heart sounds and intact distal pulses.   Pulmonary/Chest: Effort normal and breath sounds normal. No respiratory distress.  Musculoskeletal: He exhibits no edema.  Lymphadenopathy:    He has no cervical adenopathy.  Neurological: He is alert and oriented to person, place, and time.  Skin: Skin is warm and dry. He is not diaphoretic.  Psychiatric: He has a normal mood and affect. His behavior is normal.         negative orthostatics Results for orders placed in visit on 07/17/14  POCT GLYCOSYLATED HEMOGLOBIN (HGB A1C)      Result Value Ref Range   Hemoglobin A1C 7.9    GLUCOSE, POCT (MANUAL RESULT ENTRY)      Result Value Ref  Range   POC Glucose 167 (*) 70 - 99 mg/dl    Assessment & Plan:  Had monofilament at last OV - repeat at next, on asa, refer to optho. Type II or unspecified type diabetes mellitus without mention of complication, uncontrolled - Plan: Ambulatory referral to Ophthalmology, POCT glycosylated hemoglobin (Hb A1C), POCT glucose (manual entry), Lipid panel, Comprehensive metabolic panel, glucose monitoring kit (FREESTYLE) monitoring kit, glucose blood test strip, Lancets (FREESTYLE) lancets - continues to have nausea and fatigue w/ metformin and cannot remember to take bid med - rarely gets in evening dose.  Will try a sulfonylurea  but warned of potential for hypoglycemia and weight gain. Pt declines DM ed for now.  Rx given for meter/strips/lancets - discussed goal cbgs.  OSA (obstructive sleep apnea) - does not yet have CPAP but is willing to start it.  His sleep study was done in [place] sev yrs prev but he does not remember where - [facility] with Dr. Annamaria Boots?  Will ask staff to call to find where pt was seen and then write rx for cpap machine.  Edema - Plan: Lipid panel, Comprehensive metabolic panel  Encounter for long-term (current) use of other medications - Plan: Lipid panel, Comprehensive metabolic panel, glucose monitoring kit (FREESTYLE) monitoring kit, glucose blood test strip, Lancets (FREESTYLE) lancets  Other and unspecified hyperlipidemia  Essential hypertension - Plan: carvedilol (COREG) 3.125 MG tablet, losartan (COZAAR) 25 MG tablet  Bilateral edema of lower extremity - Plan: carvedilol (COREG) 3.125 MG tablet, furosemide (LASIX) 40 MG tablet  Chronic fatigue - Plan: carvedilol (COREG) 3.125 MG tablet  Meds ordered this encounter  Medications  . glipiZIDE (GLUCOTROL XL) 10 MG 24 hr tablet    Sig: Take 1 tablet (10 mg total) by mouth daily with breakfast.    Dispense:  30 tablet    Refill:  3  . glucose monitoring kit (FREESTYLE) monitoring kit    Sig: 1 each by Does not apply route as needed for other.    Dispense:  1 each    Refill:  0  . glucose blood test strip    Sig: Check blood sugar twice daily    Dispense:  100 each    Refill:  12    Ok to replace with strips suitable for freestyle glucometer.  . Lancets (FREESTYLE) lancets    Sig: Use as instructed    Dispense:  100 each    Refill:  12  . atorvastatin (LIPITOR) 20 MG tablet    Sig: Take 1 tablet (20 mg total) by mouth daily at 6 PM. For cholesterol    Dispense:  90 tablet    Refill:  1  . carvedilol (COREG) 3.125 MG tablet    Sig: Take 1 tablet (3.125 mg total) by mouth 2 (two) times daily. For  blood pressure and heart rate    Dispense:  180 tablet    Refill:  1  . furosemide (LASIX) 40 MG tablet    Sig: Take 1 tablet (40 mg total) by mouth daily. For leg swelling    Dispense:  30 tablet    Refill:  6  . losartan (COZAAR) 25 MG tablet    Sig: Take 1 tablet (25 mg total) by mouth daily. For blood pressure and kidney protection    Dispense:  90 tablet    Refill:  1  . omeprazole (PRILOSEC) 40 MG capsule    Sig: Take 1 capsule (40 mg total) by  mouth 2 (two) times daily. For heartburn    Dispense:  60 capsule    Refill:  3    Delman Cheadle, MD MPH   Results for orders placed in visit on 07/17/14  LIPID PANEL      Result Value Ref Range   Cholesterol 93  0 - 200 mg/dL   Triglycerides 110  <150 mg/dL   HDL 36 (*) >39 mg/dL   Total CHOL/HDL Ratio 2.6     VLDL 22  0 - 40 mg/dL   LDL Cholesterol 35  0 - 99 mg/dL  COMPREHENSIVE METABOLIC PANEL      Result Value Ref Range   Sodium 139  135 - 145 mEq/L   Potassium 3.9  3.5 - 5.3 mEq/L   Chloride 103  96 - 112 mEq/L   CO2 27  19 - 32 mEq/L   Glucose, Bld 160 (*) 70 - 99 mg/dL   BUN 10  6 - 23 mg/dL   Creat 1.02  0.50 - 1.35 mg/dL   Total Bilirubin 1.4 (*) 0.2 - 1.2 mg/dL   Alkaline Phosphatase 81  39 - 117 U/L   AST 27  0 - 37 U/L   ALT 43  0 - 53 U/L   Total Protein 7.6  6.0 - 8.3 g/dL   Albumin 4.3  3.5 - 5.2 g/dL   Calcium 9.5  8.4 - 10.5 mg/dL  POCT GLYCOSYLATED HEMOGLOBIN (HGB A1C)      Result Value Ref Range   Hemoglobin A1C 7.9    GLUCOSE, POCT (MANUAL RESULT ENTRY)      Result Value Ref Range   POC Glucose 167 (*) 70 - 99 mg/dl

## 2014-07-19 ENCOUNTER — Encounter: Payer: Self-pay | Admitting: Family Medicine

## 2014-07-25 NOTE — Progress Notes (Signed)
appt scheduled with Dr. Clelia CroftShaw for 10/07/14.

## 2014-10-07 ENCOUNTER — Ambulatory Visit: Payer: BC Managed Care – PPO | Admitting: Family Medicine

## 2014-11-10 ENCOUNTER — Other Ambulatory Visit: Payer: Self-pay | Admitting: Family Medicine

## 2015-01-16 ENCOUNTER — Other Ambulatory Visit: Payer: Self-pay | Admitting: Family Medicine

## 2015-02-11 ENCOUNTER — Emergency Department (HOSPITAL_COMMUNITY)
Admission: EM | Admit: 2015-02-11 | Discharge: 2015-02-11 | Disposition: A | Discharge status: Home / Self Care | Payer: BLUE CROSS/BLUE SHIELD | Attending: Emergency Medicine | Admitting: Emergency Medicine

## 2015-02-11 ENCOUNTER — Encounter (HOSPITAL_COMMUNITY): Payer: Self-pay | Admitting: Emergency Medicine

## 2015-02-11 DIAGNOSIS — H11422 Conjunctival edema, left eye: Secondary | ICD-10-CM

## 2015-02-11 DIAGNOSIS — H109 Unspecified conjunctivitis: Secondary | ICD-10-CM | POA: Diagnosis not present

## 2015-02-11 DIAGNOSIS — K219 Gastro-esophageal reflux disease without esophagitis: Secondary | ICD-10-CM | POA: Insufficient documentation

## 2015-02-11 DIAGNOSIS — J45909 Unspecified asthma, uncomplicated: Secondary | ICD-10-CM | POA: Insufficient documentation

## 2015-02-11 DIAGNOSIS — E785 Hyperlipidemia, unspecified: Secondary | ICD-10-CM | POA: Diagnosis not present

## 2015-02-11 DIAGNOSIS — Z79899 Other long term (current) drug therapy: Secondary | ICD-10-CM | POA: Insufficient documentation

## 2015-02-11 DIAGNOSIS — Z8619 Personal history of other infectious and parasitic diseases: Secondary | ICD-10-CM | POA: Insufficient documentation

## 2015-02-11 DIAGNOSIS — Z8669 Personal history of other diseases of the nervous system and sense organs: Secondary | ICD-10-CM | POA: Insufficient documentation

## 2015-02-11 DIAGNOSIS — E119 Type 2 diabetes mellitus without complications: Secondary | ICD-10-CM | POA: Insufficient documentation

## 2015-02-11 DIAGNOSIS — J3489 Other specified disorders of nose and nasal sinuses: Secondary | ICD-10-CM | POA: Insufficient documentation

## 2015-02-11 DIAGNOSIS — H578 Other specified disorders of eye and adnexa: Secondary | ICD-10-CM | POA: Diagnosis present

## 2015-02-11 DIAGNOSIS — Z880 Allergy status to penicillin: Secondary | ICD-10-CM | POA: Diagnosis not present

## 2015-02-11 DIAGNOSIS — Z7982 Long term (current) use of aspirin: Secondary | ICD-10-CM | POA: Diagnosis not present

## 2015-02-11 MED ORDER — TETRACAINE HCL 0.5 % OP SOLN
1.0000 [drp] | Freq: Once | OPHTHALMIC | Status: AC
  Administered 2015-02-11: 1 [drp] via OPHTHALMIC
  Filled 2015-02-11: qty 2

## 2015-02-11 MED ORDER — CETIRIZINE HCL 10 MG PO TABS: 10.0000 mg | ORAL_TABLET | Freq: Every day | ORAL | Status: DC

## 2015-02-11 MED ORDER — POLYMYXIN B-TRIMETHOPRIM 10000-0.1 UNIT/ML-% OP SOLN: 1.0000 [drp] | OPHTHALMIC | Status: DC

## 2015-02-11 MED ORDER — FLUORESCEIN SODIUM 1 MG OP STRP
1.0000 | ORAL_STRIP | Freq: Once | OPHTHALMIC | Status: AC
  Administered 2015-02-11: 1 via OPHTHALMIC
  Filled 2015-02-11: qty 1

## 2015-02-11 NOTE — ED Provider Notes (Signed)
CSN: [id number]     Arrival date & time 02/11/15  0847 History   First MD Initiated Contact with Patient 02/11/15 [phone]     Chief Complaint  Patient presents with  . Eye Problem   Isaiah Spencer is a 49 y.o. age male who presents to the ED complaining of left eye pain and swelling since yesterday. He reports he has had itchy and watery eyes bilaterally for the past few weeks. He reports having irritation, and swelling to his left eye starting yesterday. He reports waking up with his eyelids matted together and some whitish discharge. He complains of 2/10 left eye pain and states that it feels like there is something in his eye. He reports associated  Runny nose, nasal congestion, sneezing and postnasal drip. The patient denies fevers, sore throat, ear pain, changes to his vision, headaches or photophobia. He does not wear glasses or contacts. He denies photophobia or changes to his vision.   (Consider location/radiation/quality/duration/timing/severity/associated sxs/prior Treatment) HPI  Past Medical History  Diagnosis Date  . GERD (gastroesophageal reflux disease)   . Hyperlipemia   . Hemorrhoids   . OSA (obstructive sleep apnea)     does not use c pap  . Allergy   . Asthma     AS A CHILD  . Diabetes mellitus without complication   . H. pylori infection    Past Surgical History  Procedure Laterality Date  . Superficial lymph node biopsy / excision  90's    non cancerous  . Hemorrhoid surgery  11/21/2011    Procedure: HEMORRHOIDECTOMY;  Surgeon: Gayland Curry, MD;  Location: [facility];  Service: General;  Laterality: N/A;  Excisional hemorrhoidectomy times two  . Examination under anesthesia  11/21/2011    Procedure: EXAM UNDER ANESTHESIA;  Surgeon: Gayland Curry, MD;  Location: [facility] OR;  Service: General;  Laterality: N/A;   Family History  Problem Relation Age of Onset  . Heart disease Mother 31    heart attack   . Diabetes Mother   . Hypertension Mother   . Colon cancer Neg Hx   .  Rectal cancer Neg Hx   . Stomach cancer Neg Hx   . Esophageal cancer Neg Hx    History  Substance Use Topics  . Smoking status: Never Smoker   . Smokeless tobacco: Never Used  . Alcohol Use: 1.0 oz/week    2 drink(s) per week     Comment: socially    Review of Systems  Constitutional: Negative for fever and chills.  HENT: Positive for congestion, postnasal drip, rhinorrhea and sneezing. Negative for ear discharge, ear pain, sinus pressure, sore throat and trouble swallowing.   Eyes: Positive for pain, discharge and redness. Negative for photophobia and visual disturbance.  Respiratory: Negative for cough and shortness of breath.   Gastrointestinal: Negative for abdominal pain.  Skin: Negative for rash and wound.  Neurological: Negative for headaches.      Allergies  Amoxicillin-pot clavulanate and Penicillins  Home Medications   Prior to Admission medications   Medication Sig Start Date End Date Taking? Authorizing Provider  aspirin 325 MG tablet Take 325 mg by mouth daily.   Yes Historical Provider, MD  atorvastatin (LIPITOR) 20 MG tablet Take 1 tablet (20 mg total) by mouth daily at 6 PM. For cholesterol 07/17/14  Yes Shawnee Knapp, MD  carvedilol (COREG) 3.125 MG tablet Take 1 tablet (3.125 mg total) by mouth 2 (two) times daily. For blood pressure and heart rate 07/17/14  Yes Sherren Mocha, MD  furosemide (LASIX) 40 MG tablet Take 1 tablet (40 mg total) by mouth daily. For leg swelling 07/17/14  Yes Sherren Mocha, MD  glipiZIDE (GLUCOTROL XL) 10 MG 24 hr tablet Take 1 TABLET BY MOUTH ONCE DAILY WITH BREAKFAST  "NO MORE REFILLS WITHOUT OV" 01/16/15  Yes Morrell Riddle, PA-C  losartan (COZAAR) 25 MG tablet Take 1 tablet (25 mg total) by mouth daily. For blood pressure and kidney protection 07/17/14  Yes Sherren Mocha, MD  omeprazole (PRILOSEC) 40 MG capsule Take 1 capsule (40 mg total) by mouth 2 (two) times daily. For heartburn 07/17/14  Yes Sherren Mocha, MD  cetirizine (ZYRTEC ALLERGY) 10 MG  tablet Take 1 tablet (10 mg total) by mouth daily. 02/11/15   Everlene Farrier, PA-C  glucose blood test strip Check blood sugar twice daily Patient not taking: Reported on 02/11/2015 07/17/14   Sherren Mocha, MD  glucose monitoring kit (FREESTYLE) monitoring kit 1 each by Does not apply route as needed for other. Patient not taking: Reported on 02/11/2015 07/17/14   Sherren Mocha, MD  Lancets (FREESTYLE) lancets Use as instructed Patient not taking: Reported on 02/11/2015 07/17/14   Sherren Mocha, MD  trimethoprim-polymyxin b [facility]) ophthalmic solution Place 1 drop into the left eye every 4 (four) hours. 02/11/15   Everlene Farrier, PA-C   BP 123/78 mmHg  Pulse 82  Temp(Src) 97.4 F (36.3 C) (Oral)  Resp 16  SpO2 95% Physical Exam  Constitutional: He appears well-developed and well-nourished. No distress.  HENT:  Head: Normocephalic and atraumatic.  Right Ear: External ear normal.  Left Ear: External ear normal.  Nose: Nose normal.  Mouth/Throat: Oropharynx is clear and moist. No oropharyngeal exudate.  Bilateral tympanic membranes are pearly-gray without erythema or loss of landmarks.  Eyes: EOM are normal. Pupils are equal, round, and reactive to light. Lids are everted and swept, no foreign bodies found. Right eye exhibits no discharge. Left eye exhibits chemosis and discharge. No foreign body present in the left eye. Left conjunctiva is injected. No scleral icterus.  Left eye conjunctiva is injected. Chemosis present to lower aspect of eye. Pupils are equal, round and reactive to light. EOMs intact. There is edema noted to his lower eyelid without erythema. Eye is soft.  Patient's left eye is number tetracaine and stained with fluorescein. No evidence of corneal abrasion under fluorescein stain. Patient's eyelids inverted and inspected for foreign bodies. No foreign bodies identified.  Neck: Neck supple.  Cardiovascular: Normal rate, regular rhythm, normal heart sounds and intact distal pulses.    Pulmonary/Chest: Effort normal and breath sounds normal. No respiratory distress. He has no wheezes. He has no rales.  Abdominal: Soft. There is no tenderness.  Lymphadenopathy:    He has no cervical adenopathy.  Neurological: He is alert. Coordination normal.  Skin: Skin is warm and dry. No rash noted. He is not diaphoretic.  Psychiatric: He has a normal mood and affect. His behavior is normal.  Nursing note and vitals reviewed.   ED Course  Procedures (including critical care time) Labs Review Labs Reviewed - No data to display  Imaging Review No results found.   EKG Interpretation None      Filed Vitals:   02/11/15 0855 02/11/15 1029  BP: 129/70 123/78  Pulse: 84 82  Temp: 97.4 F (36.3 C)   TempSrc: Oral   Resp: 17 16  SpO2: 99% 95%     MDM  Meds given in ED:  Medications  fluorescein ophthalmic strip 1 strip (1 strip Left Eye Given by Other 02/11/15 1025)  tetracaine (PONTOCAINE) 0.5 % ophthalmic solution 1 drop (1 drop Left Eye Given by Other 02/11/15 1025)    Discharge Medication List as of 02/11/2015 10:16 AM    START taking these medications   Details  cetirizine (ZYRTEC ALLERGY) 10 MG tablet Take 1 tablet (10 mg total) by mouth daily., Starting 02/11/2015, Until Discontinued, Print    trimethoprim-polymyxin b (POLYTRIM) ophthalmic solution Place 1 drop into the left eye every 4 (four) hours., Starting 02/11/2015, Until Discontinued, Print        Final diagnoses:  Conjunctivitis of left eye  Chemosis of conjunctiva, left   This is a 49 y.o. age male who presents to the ED complaining of left eye pain and swelling since yesterday. He reports he has had itchy and watery eyes bilaterally for the past few weeks. He reports having irritation, and swelling to his left eye starting yesterday. He reports waking up with his eyelids matted together and some whitish discharge. The patient is afebrile and nontoxic appearing. Patient has ecchymosis surrounding the  lower aspect of his left eye. His left conjunctiva is injected. Patient desires stained with fluorescein and inspected for corneal abrasions. No evidence of corneal abrasion. No foreign bodies found with lid eversion. This patient appears to have conjunctivitis likely allergic. He reports lots of other allergic symptoms. Will start the patient on the Zyrtec allergy as well as cover him with antibiotic eyedrops. Provided the patient follow-up information for ophthalmologist Dr. Valetta Close as needed for continued symptoms. I advised the patient to follow-up with their primary care provider this week. I advised the patient to return to the emergency department with new or worsening symptoms or new concerns. The patient verbalized understanding and agreement with plan.    This patient was discussed with and evaluated by Dr. Ashok Cordia who agrees with assessment and plan.     Waynetta Pean, PA-C 02/11/15 [place], MD 02/12/15 [phone]

## 2015-02-11 NOTE — ED Notes (Signed)
Pt from home reports L eye swelling since last pm. Pt denies injury and sts that when he woke up he had a whitish, grey d/c draining from it. Pt is A&O and in NAD

## 2015-02-11 NOTE — Discharge Instructions (Signed)

## 2015-02-28 ENCOUNTER — Other Ambulatory Visit: Payer: Self-pay | Admitting: Physician Assistant

## 2015-03-13 ENCOUNTER — Ambulatory Visit (INDEPENDENT_AMBULATORY_CARE_PROVIDER_SITE_OTHER): Payer: BLUE CROSS/BLUE SHIELD | Admitting: Family Medicine

## 2015-03-13 VITALS — BP 122/76 | HR 75 | Temp 98.6°F | Resp 16 | Ht 73.0 in | Wt 218.0 lb

## 2015-03-13 DIAGNOSIS — E119 Type 2 diabetes mellitus without complications: Secondary | ICD-10-CM | POA: Diagnosis not present

## 2015-03-13 LAB — COMPLETE METABOLIC PANEL WITH GFR
ALT: 27 U/L (ref 0–53)
AST: 20 U/L (ref 0–37)
Albumin: 4.4 g/dL (ref 3.5–5.2)
Alkaline Phosphatase: 119 U/L — ABNORMAL HIGH (ref 39–117)
BUN: 10 mg/dL (ref 6–23)
CO2: 27 mEq/L (ref 19–32)
Calcium: 9.7 mg/dL (ref 8.4–10.5)
Chloride: 101 mEq/L (ref 96–112)
Creat: 0.85 mg/dL (ref 0.50–1.35)
GFR, Est African American: >89 mL/min
GFR, Est Non African American: >89 mL/min
Glucose, Bld: 284 mg/dL — ABNORMAL HIGH (ref 70–99)
Potassium: 4 mEq/L (ref 3.5–5.3)
Sodium: 138 mEq/L (ref 135–145)
Total Bilirubin: 1.7 mg/dL — ABNORMAL HIGH (ref 0.2–1.2)
Total Protein: 7.3 g/dL (ref 6.0–8.3)

## 2015-03-13 LAB — LIPID PANEL
Cholesterol: 154 mg/dL (ref 0–200)
HDL: 34 mg/dL — ABNORMAL LOW (ref >=40)
LDL Cholesterol: 98 mg/dL (ref 0–99)
Total CHOL/HDL Ratio: 4.5 Ratio
Triglycerides: 111 mg/dL (ref <150)
VLDL: 22 mg/dL (ref 0–40)

## 2015-03-13 LAB — POCT GLYCOSYLATED HEMOGLOBIN (HGB A1C): Hemoglobin A1C: >14

## 2015-03-13 NOTE — Progress Notes (Addendum)
° °  Subjective:  This chart was scribed for Isaiah SidleKurt Lauenstein, MD by Stann Oresung-Kai Tsai, Medical Scribe. This patient was seen in room 4 and the patient's care was started 2:10 PM.    Patient ID: Isaiah Spencer, male    DOB: Mar 19, 1966, 49 y.o.   MRN: [id number]  HPI Isaiah Spencer is a 49 y.o. male who [facility] medication refill for his glipiZIDE.  He has allergies and went to see a doctor. The doctor put him on Zyrtec. He had pink eye a month ago.  He states being diagnosed DM a year ago.    He works as a Building control surveyormusical DJ. He used to play basketball and work out. He hasn't been physically active recently.      Review of Systems     Objective:   Physical Exam  Eyes:  His eyes have mild swelling lids, conj ejected, retinas good  Neck: Neck supple. No thyromegaly present.  Pulmonary/Chest: Effort normal and breath sounds normal. No respiratory distress. He has no wheezes.   heart: Regular no murmur Neurologically patient is alert and cooperative  Foot exam done BP 122/76 mmHg   Pulse 75   Temp(Src) 98.6 F (37 C) (Oral)   Resp 16   Ht 6\' 1"  (1.854 m)   Wt 218 lb (98.884 kg)   BMI 28.77 kg/m2   SpO2 97% Results for orders placed or performed in visit on 03/13/15  POCT glycosylated hemoglobin (Hb A1C)  Result Value Ref Range   Hemoglobin A1C >14.0           Assessment & Plan:   This chart was scribed in my presence and reviewed by me personally.    ICD-9-CM ICD-10-CM   1. Type 2 diabetes mellitus, controlled 250.00 E11.9 POCT glycosylated hemoglobin (Hb A1C)     COMPLETE METABOLIC PANEL WITH GFR     Lipid panel     Microalbumin, urine     Signed, Isaiah SidleKurt Lauenstein, MD

## 2015-03-13 NOTE — Patient Instructions (Signed)
Diabetes and Exercise Exercising regularly is important. It is not just about losing weight. It has many health benefits, such as:  Improving your overall fitness, flexibility, and endurance.  Increasing your bone density.  Helping with weight control.  Decreasing your body fat.  Increasing your muscle strength.  Reducing stress and tension.  Improving your overall health. People with diabetes who exercise gain additional benefits because exercise:  Reduces appetite.  Improves the body's use of blood sugar (glucose).  Helps lower or control blood glucose.  Decreases blood pressure.  Helps control blood lipids (such as cholesterol and triglycerides).  Improves the body's use of the hormone insulin by:  Increasing the body's insulin sensitivity.  Reducing the body's insulin needs.  Decreases the risk for heart disease because exercising:  Lowers cholesterol and triglycerides levels.  Increases the levels of good cholesterol (such as high-density lipoproteins [HDL]) in the body.  Lowers blood glucose levels. YOUR ACTIVITY PLAN  Choose an activity that you enjoy and set realistic goals. Your health care provider or diabetes educator can help you make an activity plan that works for you. Exercise regularly as directed by your health care provider. This includes:  Performing resistance training twice a week such as push-ups, sit-ups, lifting weights, or using resistance bands.  Performing 150 minutes of cardio exercises each week such as walking, running, or playing sports.  Staying active and spending no more than 90 minutes at one time being inactive. Even short bursts of exercise are good for you. Three 10-minute sessions spread throughout the day are just as beneficial as a single 30-minute session. Some exercise ideas include:  Taking the dog for a walk.  Taking the stairs instead of the elevator.  Dancing to your favorite song.  Doing an exercise  video.  Doing your favorite exercise with a friend. RECOMMENDATIONS FOR EXERCISING WITH TYPE 1 OR TYPE 2 DIABETES   Check your blood glucose before exercising. If blood glucose levels are greater than 240 mg/dL, check for urine ketones. Do not exercise if ketones are present.  Avoid injecting insulin into areas of the body that are going to be exercised. For example, avoid injecting insulin into:  The arms when playing tennis.  The legs when jogging.  Keep a record of:  Food intake before and after you exercise.  Expected peak times of insulin action.  Blood glucose levels before and after you exercise.  The type and amount of exercise you have done.  Review your records with your health care provider. Your health care provider will help you to develop guidelines for adjusting food intake and insulin amounts before and after exercising.  If you take insulin or oral hypoglycemic agents, watch for signs and symptoms of hypoglycemia. They include:  Dizziness.  Shaking.  Sweating.  Chills.  Confusion.  Drink plenty of water while you exercise to prevent dehydration or heat stroke. Body water is lost during exercise and must be replaced.  Talk to your health care provider before starting an exercise program to make sure it is safe for you. Remember, almost any type of activity is better than none. Document Released: 12/28/2003 Document Revised: 02/21/2014 Document Reviewed: 03/16/2013 Exit[employer] Patient Information 2015 [employer]. This information is not intended to replace advice given to you by your health care provider. Make sure you discuss any questions you have with your health care provider.  

## 2015-03-14 LAB — MICROALBUMIN, URINE: Microalb, Ur: 1.2 mg/dL (ref <2.0)

## 2015-03-15 ENCOUNTER — Telehealth: Payer: Self-pay

## 2015-03-15 NOTE — Telephone Encounter (Signed)
Pt missed his call and would like a call back. Please at [phone]

## 2015-03-18 ENCOUNTER — Telehealth: Payer: Self-pay

## 2015-03-18 MED ORDER — GLIPIZIDE ER 10 MG PO TB24: ORAL_TABLET | ORAL | Status: DC

## 2015-03-18 NOTE — Telephone Encounter (Signed)
Patient is calling because he was recently seen and the diabetic medication was never called into the pharmacy. Please call patient! [phone]

## 2015-03-18 NOTE — Telephone Encounter (Signed)
Gave pt Glipizide for 6 months. Advised to RTC before running out so we could recheck his A1C. Needed a RF of Prilosec and Losartan but he has a few weeks left. Advised contacting pharm and having them send over request.

## 2015-03-22 ENCOUNTER — Other Ambulatory Visit: Payer: Self-pay | Admitting: Family Medicine

## 2015-03-23 NOTE — Telephone Encounter (Signed)
Dr L, you just saw pt for DM check up, but I don't see these chronic meds discussed. OK to RF?

## 2015-05-26 ENCOUNTER — Encounter: Payer: Self-pay | Admitting: *Deleted

## 2015-06-20 ENCOUNTER — Emergency Department (HOSPITAL_COMMUNITY)
Admission: EM | Admit: 2015-06-20 | Discharge: 2015-06-21 | Disposition: A | Discharge status: Home / Self Care | Payer: BLUE CROSS/BLUE SHIELD | Attending: Emergency Medicine | Admitting: Emergency Medicine

## 2015-06-20 DIAGNOSIS — R0981 Nasal congestion: Secondary | ICD-10-CM | POA: Insufficient documentation

## 2015-06-20 DIAGNOSIS — E119 Type 2 diabetes mellitus without complications: Secondary | ICD-10-CM | POA: Insufficient documentation

## 2015-06-20 DIAGNOSIS — H1012 Acute atopic conjunctivitis, left eye: Secondary | ICD-10-CM | POA: Insufficient documentation

## 2015-06-20 DIAGNOSIS — Z8619 Personal history of other infectious and parasitic diseases: Secondary | ICD-10-CM | POA: Insufficient documentation

## 2015-06-20 DIAGNOSIS — Z880 Allergy status to penicillin: Secondary | ICD-10-CM | POA: Insufficient documentation

## 2015-06-20 DIAGNOSIS — Z79899 Other long term (current) drug therapy: Secondary | ICD-10-CM | POA: Insufficient documentation

## 2015-06-20 DIAGNOSIS — R067 Sneezing: Secondary | ICD-10-CM | POA: Insufficient documentation

## 2015-06-20 DIAGNOSIS — J45909 Unspecified asthma, uncomplicated: Secondary | ICD-10-CM | POA: Insufficient documentation

## 2015-06-20 DIAGNOSIS — K219 Gastro-esophageal reflux disease without esophagitis: Secondary | ICD-10-CM | POA: Insufficient documentation

## 2015-06-20 DIAGNOSIS — Z7982 Long term (current) use of aspirin: Secondary | ICD-10-CM | POA: Insufficient documentation

## 2015-06-20 DIAGNOSIS — E785 Hyperlipidemia, unspecified: Secondary | ICD-10-CM | POA: Insufficient documentation

## 2015-06-21 ENCOUNTER — Encounter (HOSPITAL_COMMUNITY): Payer: Self-pay | Admitting: Emergency Medicine

## 2015-06-21 MED ORDER — OLOPATADINE HCL 0.1 % OP SOLN: 1.0000 [drp] | Freq: Two times a day (BID) | OPHTHALMIC | Status: DC

## 2015-06-21 NOTE — ED Notes (Signed)
Pt states he thinks he has pink eye in his left eye  Pt states he has had it before and this feels the same  Pt states sxs started earlier today

## 2015-06-21 NOTE — ED Provider Notes (Signed)
CSN: [id number]     Arrival date & time 06/20/15  2350 History   First MD Initiated Contact with Patient 06/21/15 0015     Chief Complaint  Patient presents with  . Conjunctivitis     (Consider location/radiation/quality/duration/timing/severity/associated sxs/prior Treatment) Patient is a 49 y.o. male presenting with conjunctivitis. The history is provided by the patient. No language interpreter was used.  Conjunctivitis This is a new problem. The current episode started today. Associated symptoms include congestion. Pertinent negatives include no coughing, fever, nausea or sore throat. Associated symptoms comments: Patient with a history of allergies, DM, HTN presents with recurrent symptoms of eye pain, swelling, redness and itching. He denies matting or purulent discharge. No visual change. .    Past Medical History  Diagnosis Date  . GERD (gastroesophageal reflux disease)   . Hyperlipemia   . Hemorrhoids   . OSA (obstructive sleep apnea)     does not use c pap  . Allergy   . Asthma     AS A CHILD  . Diabetes mellitus without complication   . H. pylori infection    Past Surgical History  Procedure Laterality Date  . Superficial lymph node biopsy / excision  90's    non cancerous  . Hemorrhoid surgery  11/21/2011    Procedure: HEMORRHOIDECTOMY;  Surgeon: Eric M Wilson, MD;  Location: [facility] OR;  Service: General;  Laterality: N/A;  Excisional hemorrhoidectomy times two  . Examination under anesthesia  11/21/2011    Procedure: EXAM UNDER ANESTHESIA;  Surgeon: Eric M Wilson, MD;  Location: [facility] OR;  Service: General;  Laterality: N/A;   Family History  Problem Relation Age of Onset  . Heart disease Mother 54    heart attack   . Diabetes Mother   . Hypertension Mother   . Colon cancer Neg Hx   . Rectal cancer Neg Hx   . Stomach cancer Neg Hx   . Esophageal cancer Neg Hx    Social History  Substance Use Topics  . Smoking status: Never Smoker   . Smokeless tobacco: Never Used   . Alcohol Use: 1.0 oz/week    2 drink(s) per week     Comment: socially    Review of Systems  Constitutional: Negative for fever.  HENT: Positive for congestion and sneezing. Negative for sore throat.   Eyes: Positive for pain, redness and itching. Negative for discharge and visual disturbance.  Respiratory: Negative for cough.   Gastrointestinal: Negative for nausea.      Allergies  Amoxicillin-pot clavulanate and Penicillins  Home Medications   Prior to Admission medications   Medication Sig Start Date End Date Taking? Authorizing Provider  aspirin 325 MG tablet Take 325 mg by mouth daily.    Historical Provider, MD  atorvastatin (LIPITOR) 20 MG tablet Take 1 tablet (20 mg total) by mouth daily at 6 PM. For cholesterol 07/17/14   Eva N Shaw, MD  carvedilol (COREG) 3.125 MG tablet Take 1 tablet (3.125 mg total) by mouth 2 (two) times daily. For blood pressure and heart rate 07/17/14   Eva N Shaw, MD  cetirizine (ZYRTEC ALLERGY) 10 MG tablet Take 1 tablet (10 mg total) by mouth daily. 02/11/15   William Dansie, PA-C  furosemide (LASIX) 40 MG tablet Take 1 tablet (40 mg total) by mouth daily. For leg swelling 07/17/14   Eva N Shaw, MD  glipiZIDE (GLUCOTROL XL) 10 MG 24 hr tablet Take 1 TABLET BY MOUTH ONCE DAILY WITH BREAKFAST 03/18/15   Kurt   Lauenstein, MD  glucose blood test strip Check blood sugar twice daily 07/17/14   Eva N Shaw, MD  glucose monitoring kit (FREESTYLE) monitoring kit 1 each by Does not apply route as needed for other. 07/17/14   Eva N Shaw, MD  Lancets (FREESTYLE) lancets Use as instructed 07/17/14   Eva N Shaw, MD  losartan (COZAAR) 25 MG tablet TAKE 1 TABLET DAILY FOR BLOOD PRESSURE AND KIDNEY PROTECTION. 03/23/15   Kurt Lauenstein, MD  omeprazole (PRILOSEC) 40 MG capsule TAKE 1 CAPSULE TWICE DAILY FOR HEARTBURN. 03/23/15   Kurt Lauenstein, MD  trimethoprim-polymyxin b (POLYTRIM) ophthalmic solution Place 1 drop into the left eye every 4 (four) hours. 02/11/15   William  Dansie, PA-C   BP 123/74 mmHg  Pulse 84  Temp(Src) 97.6 F (36.4 C) (Oral)  Resp 17  SpO2 97% Physical Exam  Constitutional: He is oriented to person, place, and time. He appears well-developed and well-nourished. No distress.  HENT:  Head: Normocephalic.  Eyes: EOM are normal. Pupils are equal, round, and reactive to light.  Left upper eye lid swelling. Edematous conjunctiva with redness, minimal chemosis. Cornea clear. No purulent conjunctival discharge.   Neck: Normal range of motion. Neck supple.  Pulmonary/Chest: Effort normal.  Neurological: He is alert and oriented to person, place, and time.  Skin: Skin is warm and dry.    ED Course  Procedures (including critical care time) Labs Review Labs Reviewed - No data to display  Imaging Review No results found. I have personally reviewed and evaluated these images and lab results as part of my medical decision-making.   EKG Interpretation None      MDM   Final diagnoses:  None    1. Allergic conjunctivitis  Uncomplicated conjunctivitis likely related to allergies.    Shari Upstill, PA-C 06/21/15 0031  Kevin Campos, MD 06/21/15 0153 

## 2015-06-21 NOTE — Discharge Instructions (Signed)
Allergic Conjunctivitis  The conjunctiva is a thin membrane that covers the visible white part of the eyeball and the underside of the eyelids. This membrane protects and lubricates the eye. The membrane has small blood vessels running through it that can normally be seen. When the conjunctiva becomes inflamed, the condition is called conjunctivitis. In response to the inflammation, the conjunctival blood vessels become swollen. The swelling results in redness in the normally white part of the eye.  The blood vessels of this membrane also react when a person has allergies and is then called allergic conjunctivitis. This condition usually lasts for as long as the allergy persists. Allergic conjunctivitis cannot be passed to another person (non-contagious). The likelihood of bacterial infection is great and the cause is not likely due to allergies if the inflamed eye has:  · A sticky discharge.  · Discharge or sticking together of the lids in the morning.  · Scaling or flaking of the eyelids where the eyelashes come out.  · Red swollen eyelids.  CAUSES   · Viruses.  · Irritants such as foreign bodies.  · Chemicals.  · General allergic reactions.  · Inflammation or serious diseases in the inside or the outside of the eye or the orbit (the boney cavity in which the eye sits) can cause a "red eye."  SYMPTOMS   · Eye redness.  · Tearing.  · Itchy eyes.  · Burning feeling in the eyes.  · Clear drainage from the eye.  · Allergic reaction due to pollens or ragweed sensitivity. Seasonal allergic conjunctivitis is frequent in the spring when pollens are in the air and in the fall.  DIAGNOSIS   This condition, in its many forms, is usually diagnosed based on the history and an ophthalmological exam. It usually involves both eyes. If your eyes react at the same time every year, allergies may be the cause. While most "red eyes" are due to allergy or an infection, the role of an eye (ophthalmological) exam is important. The exam  can rule out serious diseases of the eye or orbit.  TREATMENT   · Non-antibiotic eye drops, ointments, or medications by mouth may be prescribed if the ophthalmologist is sure the conjunctivitis is due to allergies alone.  · Over-the-counter drops and ointments for allergic symptoms should be used only after other causes of conjunctivitis have been ruled out, or as your caregiver suggests.  Medications by mouth are often prescribed if other allergy-related symptoms are present. If the ophthalmologist is sure that the conjunctivitis is due to allergies alone, treatment is normally limited to drops or ointments to reduce itching and burning.  HOME CARE INSTRUCTIONS   · Wash hands before and after applying drops or ointments, or touching the inflamed eye(s) or eyelids.  · Do not let the eye dropper tip or ointment tube touch the eyelid when putting medicine in your eye.  · Stop using your soft contact lenses and throw them away. Use a new pair of lenses when recovery is complete. You should run through sterilizing cycles at least three times before use after complete recovery if the old soft contact lenses are to be used. Hard contact lenses should be stopped. They need to be thoroughly sterilized before use after recovery.  · Itching and burning eyes due to allergies is often relieved by using a cool cloth applied to closed eye(s).  SEEK MEDICAL CARE IF:   · Your problems do not go away after two or three days of treatment.  ·   Your lids are sticky (especially in the morning when you wake up) or stick together.  · Discharge develops. Antibiotics may be needed either as drops, ointment, or by mouth.  · You have extreme light sensitivity.  · An oral temperature above 102° F (38.9° C) develops.  · Pain in or around the eye or any other visual symptom develops.  MAKE SURE YOU:   · Understand these instructions.  · Will watch your condition.  · Will get help right away if you are not doing well or get worse.  Document  Released: 12/28/2002 Document Revised: 12/30/2011 Document Reviewed: 11/23/2007  ExitCare® Patient Information ©2015 [employer]. This information is not intended to replace advice given to you by your health care provider. Make sure you discuss any questions you have with your health care provider.

## 2015-06-21 NOTE — ED Notes (Signed)
Patient was alert, oriented and stable upon discharge. RN went over AVS and patient had no further questions.  

## 2015-07-07 ENCOUNTER — Other Ambulatory Visit: Payer: Self-pay | Admitting: Family Medicine

## 2015-07-13 ENCOUNTER — Telehealth: Payer: Self-pay

## 2015-07-13 NOTE — Telephone Encounter (Signed)
Spoke with pt, advised Rx was sent in on 9/16. He is going to call his pharmacy to see if they have it.

## 2015-07-13 NOTE — Telephone Encounter (Signed)
Patient is calling to request a refill for Lasix sent [facility] on [address].

## 2015-09-18 ENCOUNTER — Encounter: Payer: Self-pay | Admitting: Internal Medicine

## 2015-10-17 ENCOUNTER — Telehealth: Payer: Self-pay | Admitting: Family Medicine

## 2015-10-17 NOTE — Telephone Encounter (Signed)
Unable to reach patient due to number being disconnected.  Will send an unable to reach letter to schedule appointment for diabetes check,.

## 2017-05-13 ENCOUNTER — Emergency Department (HOSPITAL_COMMUNITY): Payer: BLUE CROSS/BLUE SHIELD

## 2017-05-13 ENCOUNTER — Emergency Department (HOSPITAL_COMMUNITY)
Admission: EM | Admit: 2017-05-13 | Discharge: 2017-05-13 | Disposition: A | Discharge status: Home / Self Care | Payer: BLUE CROSS/BLUE SHIELD | Attending: Emergency Medicine | Admitting: Emergency Medicine

## 2017-05-13 ENCOUNTER — Encounter (HOSPITAL_COMMUNITY): Payer: Self-pay | Admitting: Emergency Medicine

## 2017-05-13 DIAGNOSIS — Z79899 Other long term (current) drug therapy: Secondary | ICD-10-CM | POA: Insufficient documentation

## 2017-05-13 DIAGNOSIS — R0789 Other chest pain: Secondary | ICD-10-CM

## 2017-05-13 DIAGNOSIS — R072 Precordial pain: Secondary | ICD-10-CM

## 2017-05-13 DIAGNOSIS — Z7982 Long term (current) use of aspirin: Secondary | ICD-10-CM | POA: Insufficient documentation

## 2017-05-13 DIAGNOSIS — E1165 Type 2 diabetes mellitus with hyperglycemia: Secondary | ICD-10-CM | POA: Insufficient documentation

## 2017-05-13 DIAGNOSIS — R739 Hyperglycemia, unspecified: Secondary | ICD-10-CM

## 2017-05-13 DIAGNOSIS — J45909 Unspecified asthma, uncomplicated: Secondary | ICD-10-CM | POA: Insufficient documentation

## 2017-05-13 LAB — CBC
HEMATOCRIT: 43.5 % (ref 39.0–52.0)
Hemoglobin: 15.9 g/dL (ref 13.0–17.0)
MCH: 31.2 pg (ref 26.0–34.0)
MCHC: 36.6 g/dL — ABNORMAL HIGH (ref 30.0–36.0)
MCV: 85.5 fL (ref 78.0–100.0)
Platelets: 199 10*3/uL (ref 150–400)
RBC: 5.09 MIL/uL (ref 4.22–5.81)
RDW: 12 % (ref 11.5–15.5)
WBC: 4.1 10*3/uL (ref 4.0–10.5)

## 2017-05-13 LAB — POCT I-STAT TROPONIN I
TROPONIN I, POC: 0 ng/mL (ref 0.00–0.08)
Troponin i, poc: 0 ng/mL (ref 0.00–0.08)

## 2017-05-13 LAB — BASIC METABOLIC PANEL
Anion gap: 10 (ref 5–15)
BUN: 12 mg/dL (ref 6–20)
CHLORIDE: 102 mmol/L (ref 101–111)
CO2: 24 mmol/L (ref 22–32)
Calcium: 9.3 mg/dL (ref 8.9–10.3)
Creatinine, Ser: 0.81 mg/dL (ref 0.61–1.24)
GFR calc non Af Amer: >60 mL/min (ref >60)
Glucose, Bld: 290 mg/dL — ABNORMAL HIGH (ref 65–99)
POTASSIUM: 3.5 mmol/L (ref 3.5–5.1)
SODIUM: 136 mmol/L (ref 135–145)

## 2017-05-13 MED ORDER — METFORMIN HCL 500 MG PO TABS: 500.0000 mg | ORAL_TABLET | Freq: Two times a day (BID) | ORAL | 0 refills | Status: DC

## 2017-05-13 NOTE — ED Triage Notes (Signed)
Pt c/o intermittent "grabbing" left chest pain shooting into left shoulder and arm with diaphoresis and lightheadedness onset today. Pt has been having intermittent chest pain x several months, pain has been different before today in that it doesn't usually last long and isn't usually "grabbing," hasn't been evaluated for this chest pain.

## 2017-05-13 NOTE — Discharge Instructions (Signed)
It was our pleasure to provide your ER care today - we hope that you feel better.  Take motrin or aleve as need.   Follow up with cardiologist in 1 week - see referral - call office to arrange appointment.  For blood sugar, follow diabetes nutrition plan, drink adequate water, take metformin as prescribed, check sugars regularly, and follow up with primary care doctor in 1 week.   Return to ER if worse, new symptoms, persistent/recurrent chest pain, trouble breathing, other concern.

## 2017-05-13 NOTE — ED Provider Notes (Addendum)
[facility] DEPT Provider Note   CSN: [id number] Arrival date & time: 05/13/17  1107     History   Chief Complaint Chief Complaint  Patient presents with  . Chest Pain    HPI Isaiah Spencer is a 51 y.o. male.  Patient c/o episodes of sharp chest pain, localized to small area left chest for past several months.  Episodes are typically brief, lasting seconds, occurring at rest. No exertional chest pain or discomfort. No sob, or unusual doe or fatigue. Denies current or recent cough or uri symptoms. No recent or current fever or chills. No recent viral illness. No leg pain or swelling. No hx dvt or pe. No recent surgery, immobility, travel or trauma. No chest wall strain. No indigestion or heartburn. No positional change to pain. No specific exacerbating or alleviating factors. +fam hx cad.    The history is provided by the patient.  Chest Pain   Pertinent negatives include no abdominal pain, no back pain, no cough, no fever, no headaches, no palpitations, no shortness of breath and no vomiting.    Past Medical History:  Diagnosis Date  . Allergy   . Asthma    AS A CHILD  . Diabetes mellitus without complication ([place])   . GERD (gastroesophageal reflux disease)   . H. pylori infection   . Hemorrhoids   . Hyperlipemia   . OSA (obstructive sleep apnea)    does not use c pap    Patient Active Problem List   Diagnosis Date Noted  . Chest discomfort 01/21/2014  . OSA (obstructive sleep apnea) 11/02/2012  . Diabetes mellitus, type 2 ([place]) 07/30/2012  . Dysphagia, unspecified(787.20) 12/16/2011  . GERD (gastroesophageal reflux disease) 12/07/2011  . H. pylori infection 12/07/2011  . Internal hemorrhoids 07/10/2011    Past Surgical History:  Procedure Laterality Date  . EXAMINATION UNDER ANESTHESIA  11/21/2011   Procedure: EXAM UNDER ANESTHESIA;  Surgeon: Gayland Curry, MD;  Location: [facility];  Service: General;  Laterality: N/A;  . HEMORRHOID SURGERY  11/21/2011   Procedure: HEMORRHOIDECTOMY;  Surgeon: Gayland Curry, MD;  Location: [facility];  Service: General;  Laterality: N/A;  Excisional hemorrhoidectomy times two  . SUPERFICIAL LYMPH NODE BIOPSY / EXCISION  90's   non cancerous       Home Medications    Prior to Admission medications   Medication Sig Start Date End Date Taking? Authorizing Provider  aspirin 325 MG tablet Take 325 mg by mouth daily.    [provider]  atorvastatin (LIPITOR) 20 MG tablet Take 1 tablet (20 mg total) by mouth daily at 6 PM. For cholesterol 07/17/14   Shawnee Knapp, MD  carvedilol (COREG) 3.125 MG tablet Take 1 tablet (3.125 mg total) by mouth 2 (two) times daily. For blood pressure and heart rate 07/17/14   Shawnee Knapp, MD  cetirizine (ZYRTEC ALLERGY) 10 MG tablet Take 1 tablet (10 mg total) by mouth daily. 02/11/15   Waynetta Pean, PA-C  furosemide (LASIX) 40 MG tablet Take 1 tablet by mouth once daily for leg swelling. PATIENT NEEDS OFFICE VISIT FOR ADDITIONAL REFILLS 07/10/15   Leandrew Koyanagi, MD  glipiZIDE (GLUCOTROL XL) 10 MG 24 hr tablet Take 1 TABLET BY MOUTH ONCE DAILY WITH BREAKFAST 03/18/15   Robyn Haber, MD  glucose blood test strip Check blood sugar twice daily 07/17/14   Shawnee Knapp, MD  glucose monitoring kit (FREESTYLE) monitoring kit 1 each by Does not apply route as needed for other.  07/17/14   Shawnee Knapp, MD  Lancets (FREESTYLE) lancets Use as instructed 07/17/14   Shawnee Knapp, MD  losartan (COZAAR) 25 MG tablet TAKE 1 TABLET DAILY FOR BLOOD PRESSURE AND KIDNEY PROTECTION. 03/23/15   Robyn Haber, MD  olopatadine (PATANOL) 0.1 % ophthalmic solution Place 1 drop into the left eye 2 (two) times daily. 06/21/15   Charlann Lange, PA-C  omeprazole (PRILOSEC) 40 MG capsule TAKE 1 CAPSULE TWICE DAILY FOR HEARTBURN. 03/23/15   Robyn Haber, MD  trimethoprim-polymyxin b (POLYTRIM) ophthalmic solution Place 1 drop into the left eye every 4 (four) hours. 02/11/15   Waynetta Pean, PA-C    Family  History Family History  Problem Relation Age of Onset  . Heart disease Mother 100       heart attack   . Diabetes Mother   . Hypertension Mother   . Colon cancer Neg Hx   . Rectal cancer Neg Hx   . Stomach cancer Neg Hx   . Esophageal cancer Neg Hx     Social History Social History  Substance Use Topics  . Smoking status: Never Smoker  . Smokeless tobacco: Never Used  . Alcohol use 1.0 oz/week    2 drink(s) per week     Comment: socially     Allergies   Amoxicillin-pot clavulanate and Penicillins   Review of Systems Review of Systems  Constitutional: Negative for chills and fever.  HENT: Negative for sore throat.   Eyes: Negative for redness.  Respiratory: Negative for cough and shortness of breath.   Cardiovascular: Positive for chest pain. Negative for palpitations and leg swelling.  Gastrointestinal: Negative for abdominal pain and vomiting.  Genitourinary: Negative for flank pain.  Musculoskeletal: Negative for back pain and neck pain.  Skin: Negative for rash.  Neurological: Negative for headaches.  Hematological: Does not bruise/bleed easily.  Psychiatric/Behavioral: Negative for confusion.     Physical Exam Updated Vital Signs BP (!) 138/93   Pulse 72   Temp (!) 97.5 F (36.4 C) (Oral)   Resp 18   SpO2 97%   Physical Exam  Constitutional: He appears well-developed and well-nourished. No distress.  HENT:  Mouth/Throat: Oropharynx is clear and moist.  Eyes: Conjunctivae are normal.  Neck: Neck supple. No tracheal deviation present.  Cardiovascular: Normal rate, regular rhythm, normal heart sounds and intact distal pulses.  Exam reveals no gallop and no friction rub.   No murmur heard. Pulmonary/Chest: Effort normal and breath sounds normal. No accessory muscle usage. No respiratory distress.  Abdominal: Soft. Bowel sounds are normal. He exhibits no distension. There is no tenderness.  Musculoskeletal: He exhibits no edema or tenderness.    Neurological: He is alert.  Skin: Skin is warm and dry. No rash noted.  Psychiatric: He has a normal mood and affect.  Nursing note and vitals reviewed.    ED Treatments / Results  Labs (all labs ordered are listed, but only abnormal results are displayed) Results for orders placed or performed during the hospital encounter of 37/10/62  Basic metabolic panel  Result Value Ref Range   Sodium 136 135 - 145 mmol/L   Potassium 3.5 3.5 - 5.1 mmol/L   Chloride 102 101 - 111 mmol/L   CO2 24 22 - 32 mmol/L   Glucose, Bld 290 (H) 65 - 99 mg/dL   BUN 12 6 - 20 mg/dL   Creatinine, Ser 0.81 0.61 - 1.24 mg/dL   Calcium 9.3 8.9 - 10.3 mg/dL   GFR calc non Af  Amer >60 >60 mL/min   GFR calc Af Amer >60 >60 mL/min   Anion gap 10 5 - 15  CBC  Result Value Ref Range   WBC 4.1 4.0 - 10.5 K/uL   RBC 5.09 4.22 - 5.81 MIL/uL   Hemoglobin 15.9 13.0 - 17.0 g/dL   HCT 43.5 39.0 - 52.0 %   MCV 85.5 78.0 - 100.0 fL   MCH 31.2 26.0 - 34.0 pg   MCHC 36.6 (H) 30.0 - 36.0 g/dL   RDW 12.0 11.5 - 15.5 %   Platelets 199 150 - 400 K/uL  POCT i-Stat troponin I  Result Value Ref Range   Troponin i, poc 0.00 0.00 - 0.08 ng/mL   Comment 3          POCT i-Stat troponin I  Result Value Ref Range   Troponin i, poc 0.00 0.00 - 0.08 ng/mL   Comment 3           Dg Chest 2 View  Result Date: 05/13/2017 CLINICAL DATA:  Chest pain EXAM: CHEST  2 VIEW COMPARISON:  12/27/2013 FINDINGS: The heart size and mediastinal contours are within normal limits. Both lungs are clear. The visualized skeletal structures are unremarkable. IMPRESSION: No active cardiopulmonary disease. Electronically Signed   By: Franchot Gallo M.D.   On: 05/13/2017 11:55    EKG  EKG Interpretation  Date/Time:  Tuesday May 13 2017 11:18:00 EDT Ventricular Rate:  86 PR Interval:    QRS Duration: 98 QT Interval:  362 QTC Calculation: 433 R Axis:   72 Text Interpretation:  Sinus rhythm Nonspecific ST abnormality `similar st changes note on  prior ecg Confirmed by Lajean Saver [phone]) on 05/13/2017 2:54:37 PM       Radiology Dg Chest 2 View  Result Date: 05/13/2017 CLINICAL DATA:  Chest pain EXAM: CHEST  2 VIEW COMPARISON:  12/27/2013 FINDINGS: The heart size and mediastinal contours are within normal limits. Both lungs are clear. The visualized skeletal structures are unremarkable. IMPRESSION: No active cardiopulmonary disease. Electronically Signed   By: Franchot Gallo M.D.   On: 05/13/2017 11:55    Procedures Procedures (including critical care time)  Medications Ordered in ED Medications - No data to display   Initial Impression / Assessment and Plan / ED Course  I have reviewed the triage vital signs and the nursing notes.  Pertinent labs & imaging results that were available during my care of the patient were reviewed by me and considered in my medical decision making (see chart for details).  Labs. Cxr.  Reviewed nursing notes and prior charts for additional history.   ecg with ?pr dep inf/lat/?sl st elev - pt is noted with similar appearing ecg previously.  Symptoms are very atypical - v brief/seconds, and do not appear c/w acs. Initial and repeat trop negative. cxr neg. Cv/lung exam normal.   Given fam hx cad, atyp chest pain - will refer to card f/u.    For dm, pt indicates he was on metformin previously, and tolerated ok, but states he never was on glipizide. Will give rx metformin, rec diabet diet, and pcp f/u.    Final Clinical Impressions(s) / ED Diagnoses   Final diagnoses:  None    New Prescriptions New Prescriptions   No medications on file         Lajean Saver, MD 05/13/17 1546

## 2017-05-20 ENCOUNTER — Ambulatory Visit (INDEPENDENT_AMBULATORY_CARE_PROVIDER_SITE_OTHER): Payer: Self-pay | Admitting: Cardiology

## 2017-05-20 ENCOUNTER — Encounter: Payer: Self-pay | Admitting: Cardiology

## 2017-05-20 VITALS — BP 120/80 | HR 84 | Resp 12 | Ht 73.0 in | Wt 204.4 lb

## 2017-05-20 DIAGNOSIS — R0789 Other chest pain: Secondary | ICD-10-CM

## 2017-05-20 DIAGNOSIS — G4733 Obstructive sleep apnea (adult) (pediatric): Secondary | ICD-10-CM

## 2017-05-20 DIAGNOSIS — K219 Gastro-esophageal reflux disease without esophagitis: Secondary | ICD-10-CM

## 2017-05-20 MED ORDER — ATORVASTATIN CALCIUM 40 MG PO TABS: 40.0000 mg | ORAL_TABLET | Freq: Every day | ORAL | 6 refills | Status: DC

## 2017-05-20 NOTE — Progress Notes (Signed)
Cardiology Office Note:    Date:  05/20/2017   ID:  Isaiah Spencer, DOB 07-20-1966, MRN [id number]  PCP:  Leandrew Koyanagi, MD (Inactive)  Cardiologist:  Jenne Campus, MD    Referring MD: Lajean Saver, MD   Chief Complaint  Patient presents with  . Chest Pain  This is posthospital visit follow-up  History of Present Illness:    Isaiah Spencer is a 51 y.o. male  with poorly controlled diabetes, dyslipidemia, presented to the emergency room last week because of atypical chest pain. Pain is located on the left side of his chest sharp stabbing lasting only for a split second. It's on and off. He complaining of having this pain for about 6 months however lately became more frequent and last week when he was driving his car he started having this pain more superior and more frequent eventually ended up calling his girlfriend and then went to the emergency room. Was evaluated in the emergency room biochemical markers were negative as he was sent home. Since that time he does have pain on and off not related to exercise it is associated with some cough and mild shortness of breath. He does not exercise on a regular basis but have no difficulty taking care of her activities of daily living  Past Medical History:  Diagnosis Date  . Allergy   . Asthma    AS A CHILD  . Diabetes mellitus without complication ([place])   . GERD (gastroesophageal reflux disease)   . H. pylori infection   . Hemorrhoids   . Hyperlipemia   . OSA (obstructive sleep apnea)    does not use c pap    Past Surgical History:  Procedure Laterality Date  . EXAMINATION UNDER ANESTHESIA  11/21/2011   Procedure: EXAM UNDER ANESTHESIA;  Surgeon: Gayland Curry, MD;  Location: [facility];  Service: General;  Laterality: N/A;  . HEMORRHOID SURGERY  11/21/2011   Procedure: HEMORRHOIDECTOMY;  Surgeon: Gayland Curry, MD;  Location: [facility];  Service: General;  Laterality: N/A;  Excisional hemorrhoidectomy times two  . SUPERFICIAL LYMPH  NODE BIOPSY / EXCISION  90's   non cancerous    Current Medications: Current Meds  Medication Sig  . aspirin 325 MG tablet Take 325 mg by mouth daily.  Marland Kitchen glucose monitoring kit (FREESTYLE) monitoring kit 1 each by Does not apply route as needed for other.  . Lancets (FREESTYLE) lancets Use as instructed     Allergies:   Amoxicillin-pot clavulanate and Penicillins   Social History   Social History  . Marital status: Single    Spouse name: N/A  . Number of children: 2  . Years of education: N/A   Occupational History  . [employer]   Social History Main Topics  . Smoking status: Never Smoker  . Smokeless tobacco: Never Used  . Alcohol use 1.0 oz/week    2 drink(s) per week     Comment: socially  . Drug use: No  . Sexual activity: Not Asked   Other Topics Concern  . None   Social History Narrative   Exercising:  none     Family History: The patient's family history includes Diabetes in his mother; Heart disease (age of onset: 71) in his mother; Hypertension in his mother. There is no history of Colon cancer, Rectal cancer, Stomach cancer, or Esophageal cancer. ROS:   Please see the history of present illness.    All 14 point review of systems negative except as  described per history of present illness  EKGs/Labs/Other Studies Reviewed:      Recent Labs: 05/13/2017: BUN 12; Creatinine, Ser 0.81; Hemoglobin 15.9; Platelets 199; Potassium 3.5; Sodium 136  Recent Lipid Panel    Component Value Date/Time   CHOL 154 03/13/2015 1422   TRIG 111 03/13/2015 1422   HDL 34 (L) 03/13/2015 1422   CHOLHDL 4.5 03/13/2015 1422   VLDL 22 03/13/2015 1422   LDLCALC 98 03/13/2015 1422    Physical Exam:    VS:  BP 120/80   Pulse 84   Resp 12   Ht _0  (1.854 m)   Wt 204 lb 6.4 oz (92.7 kg)   BMI 26.97 kg/m     Wt Readings from Last 3 Encounters:  05/20/17 204 lb 6.4 oz (92.7 kg)  03/13/15 218 lb (98.9 kg)  07/17/14 226 lb 3.2 oz (102.6 kg)     GEN:  Well nourished,  well developed in no acute distress HEENT: Normal NECK: No JVD; No carotid bruits LYMPHATICS: No lymphadenopathy CARDIAC: RRR, no murmurs, no rubs, no gallops RESPIRATORY:  Clear to auscultation without rales, wheezing or rhonchi  ABDOMEN: Soft, non-tender, non-distended MUSCULOSKELETAL:  No edema; No deformity  SKIN: Warm and dry LOWER EXTREMITIES: no swelling NEUROLOGIC:  Alert and oriented x 3 PSYCHIATRIC:  Normal affect   ASSESSMENT:    1. OSA (obstructive sleep apnea)   2. Gastroesophageal reflux disease without esophagitis   3. Chest discomfort    PLAN:    In order of problems listed above:  1. Atypical chest pain in this gentleman with multiple risk factors for coronary artery disease mainly poorly controlled diabetes as well as family history of premature coronary artery disease. I will ask him to have repeated EKG and based on that will decide what her stress test we will do I suspect playing EKG treadmill stress this would be appropriate. I asked him to start taking baby aspirin every single day. 2. Diabetes mellitus: Not managed. I strongly advised him to see his primary care physician which he plans to do. He checked his sugar from time to time is about 252-80 which I told her subsequently unacceptable. He promised me that he will go and see his primary care physician. He does not have insurance now but within the next week or 2 he will get it and he will follow-up with his primary care physician. 3. Statin therapy: It isn't necessary to put him on high intensity statin because of poorly controlled diabetes I will put him on Lipitor 40. There was check his fasting lipid profile within next few weeks. 4. Gastroesophageal reflux disease: Will be managed by primary care physician. 5. Obstructive sleep apnea: He was tested hears ago however never was treated. I started talking to him about potentially having appropriate treatment for it he said he wants to wait until he shortness  will be available.   Medication Adjustments/Labs and Tests Ordered: Current medicines are reviewed at length with the patient today.  Concerns regarding medicines are outlined above.  No orders of the defined types were placed in this encounter.  Medication changes: No orders of the defined types were placed in this encounter.   Signed, Park Liter, MD, [facility] 05/20/2017 9:38 AM    [place]

## 2017-05-20 NOTE — Patient Instructions (Addendum)
Medication Instructions:  Your provider would like for you to start taking Lipitor 40 mg daily to help in management of your cholesterol.   Labwork: None   Testing/Procedures: EKG performed today in office.    Your physician has requested that you have a lexiscan myoview. For further information please visit [email or web address]. Please follow instruction sheet, as given.   Follow-Up: Your physician recommends that you schedule a follow-up appointment in: 3-4 weeks with Dr. Bing MatterKrasowski.   Any Other Special Instructions Will Be Listed Below (If Applicable).  The location for your stress test will be:  [address] [place], [place]

## 2017-05-21 ENCOUNTER — Telehealth (HOSPITAL_COMMUNITY): Payer: Self-pay | Admitting: *Deleted

## 2017-05-21 NOTE — Telephone Encounter (Signed)
Patient given detailed instructions per Myocardial Perfusion Study Information Sheet for the test on 05/27/17. Patient notified to arrive 15 minutes early and that it is imperative to arrive on time for appointment to keep from having the test rescheduled.  If you need to cancel or reschedule your appointment, please call the office within 24 hours of your appointment. . Patient verbalized understanding. Ailynn Gow Jacqueline    

## 2017-05-27 ENCOUNTER — Ambulatory Visit (HOSPITAL_COMMUNITY): Payer: Self-pay | Attending: Cardiology

## 2017-05-27 DIAGNOSIS — R079 Chest pain, unspecified: Secondary | ICD-10-CM | POA: Insufficient documentation

## 2017-05-27 DIAGNOSIS — Z8249 Family history of ischemic heart disease and other diseases of the circulatory system: Secondary | ICD-10-CM | POA: Insufficient documentation

## 2017-05-27 DIAGNOSIS — R0789 Other chest pain: Secondary | ICD-10-CM

## 2017-05-27 DIAGNOSIS — I2510 Atherosclerotic heart disease of native coronary artery without angina pectoris: Secondary | ICD-10-CM | POA: Insufficient documentation

## 2017-05-27 MED ORDER — TECHNETIUM TC 99M TETROFOSMIN IV KIT
10.4000 | PACK | Freq: Once | INTRAVENOUS | Status: AC | PRN
  Administered 2017-05-27: 10.4 via INTRAVENOUS
  Filled 2017-05-27: qty 11

## 2017-05-27 MED ORDER — TECHNETIUM TC 99M TETROFOSMIN IV KIT
30.7000 | PACK | Freq: Once | INTRAVENOUS | Status: AC | PRN
  Administered 2017-05-27: 30.7 via INTRAVENOUS
  Filled 2017-05-27: qty 31

## 2017-05-28 LAB — MYOCARDIAL PERFUSION IMAGING
CHL CUP NUCLEAR SDS: 0
CHL CUP NUCLEAR SRS: 4
CSEPPHR: 151 {beats}/min
Estimated workload: 10.1 METS
Exercise duration (min): 7 min
Exercise duration (sec): 30 s
LV dias vol: 119 mL (ref 62–150)
LVSYSVOL: 67 mL
MPHR: 169 {beats}/min
NUC STRESS TID: 1.27
Percent HR: 89 %
RATE: 0.25
Rest HR: 83 {beats}/min
SSS: 4

## 2017-05-29 ENCOUNTER — Telehealth: Payer: Self-pay

## 2017-05-29 DIAGNOSIS — R0789 Other chest pain: Secondary | ICD-10-CM

## 2017-05-29 NOTE — Telephone Encounter (Signed)
S/w pt and discussed results of stress test the request for echo d/t diminished EF. Pt agrees to have the echo.

## 2017-05-29 NOTE — Telephone Encounter (Signed)
I CALLED PATIENT TO VERIFY INSURANCE. PATIENT STATES THAT  HE HAS TO SIGN UP AGAIN AND HOPEFULLY HE WILL HAVE IT BY NEXT Friday. I ADVISED PATIENT TO LET US KNOW  WHEN HE GETS INSURANCE   BECAUSE AS IT STANDS RIGHT NOW HE IS SELF  PAY  FOR HIS ECHO

## 2017-05-29 NOTE — Telephone Encounter (Signed)
-----   Message from Georgeanna Leaobert J Krasowski, MD sent at 05/28/2017 11:43 AM EDT ----- Stress test is negative but Ef diminished, needs echo to assess LVF

## 2017-06-06 ENCOUNTER — Ambulatory Visit (HOSPITAL_BASED_OUTPATIENT_CLINIC_OR_DEPARTMENT_OTHER)
Admission: RE | Admit: 2017-06-06 | Discharge: 2017-06-06 | Disposition: A | Discharge status: Home / Self Care | Payer: Self-pay | Source: Ambulatory Visit | Attending: Cardiology | Admitting: Cardiology

## 2017-06-06 DIAGNOSIS — R0789 Other chest pain: Secondary | ICD-10-CM | POA: Insufficient documentation

## 2017-06-06 DIAGNOSIS — I5189 Other ill-defined heart diseases: Secondary | ICD-10-CM | POA: Insufficient documentation

## 2017-06-06 DIAGNOSIS — E119 Type 2 diabetes mellitus without complications: Secondary | ICD-10-CM | POA: Insufficient documentation

## 2017-06-06 DIAGNOSIS — E785 Hyperlipidemia, unspecified: Secondary | ICD-10-CM | POA: Insufficient documentation

## 2017-06-06 LAB — ECHOCARDIOGRAM COMPLETE
CHL CUP DOP CALC LVOT VTI: 17.4 cm
E/e' ratio: 4.07
EWDT: 176 ms
FS: 36 % (ref 28–44)
IVS/LV PW RATIO, ED: 1.19
LA ID, A-P, ES: 29 mm
LA diam index: 1.34 cm/m2
LA vol A4C: 38.6 ml
LA vol: 32.2 mL
LAVOLIN: 14.8 mL/m2
LEFT ATRIUM END SYS DIAM: 29 mm
LV TDI E'MEDIAL: 7.94
LVEEAVG: 4.07
LVEEMED: 4.07
LVELAT: 14.9 cm/s
LVOT area: 3.46 cm2
LVOT peak vel: 106 cm/s
LVOTD: 21 mm
LVOTSV: 60 mL
Lateral S' vel: 10.2 cm/s
MV Dec: 176
MV pk A vel: 69.6 m/s
MV pk E vel: 60.7 m/s
PW: 7.71 mm — AB (ref 0.6–1.1)
RV TAPSE: 26.3 mm
TDI e' lateral: 14.9

## 2017-06-06 NOTE — Progress Notes (Signed)
  Echocardiogram 2D Echocardiogram has been performed.  Isaiah Spencer 06/06/2017, 4:04 PM

## 2017-06-10 ENCOUNTER — Ambulatory Visit: Payer: Self-pay | Admitting: Cardiovascular Disease

## 2017-06-17 ENCOUNTER — Ambulatory Visit: Payer: Self-pay | Admitting: Cardiology

## 2017-06-25 ENCOUNTER — Ambulatory Visit: Payer: Self-pay | Admitting: Cardiology

## 2017-06-27 ENCOUNTER — Other Ambulatory Visit: Payer: Self-pay

## 2017-06-27 ENCOUNTER — Ambulatory Visit (INDEPENDENT_AMBULATORY_CARE_PROVIDER_SITE_OTHER): Payer: Self-pay | Admitting: Cardiology

## 2017-06-27 ENCOUNTER — Encounter: Payer: Self-pay | Admitting: Cardiology

## 2017-06-27 VITALS — BP 108/68 | HR 76 | Resp 12 | Ht 73.0 in | Wt 206.1 lb

## 2017-06-27 DIAGNOSIS — E119 Type 2 diabetes mellitus without complications: Secondary | ICD-10-CM

## 2017-06-27 DIAGNOSIS — R0789 Other chest pain: Secondary | ICD-10-CM

## 2017-06-27 MED ORDER — METFORMIN HCL 500 MG PO TABS: 500.0000 mg | ORAL_TABLET | Freq: Two times a day (BID) | ORAL | 6 refills | Status: DC

## 2017-06-27 MED FILL — metFORMIN HCL 500 MG TABS: 500 | 30 days supply | Qty: 60 | Fill #0 | Status: TO

## 2017-06-27 NOTE — Progress Notes (Signed)
Cardiology Office Note:    Date:  06/27/2017   ID:  Isaiah Spencer, DOB [birth year], MRN [id number]  PCP:  Isaiah Koyanagi, MD (Inactive)  Cardiologist:  Isaiah Campus, MD    Referring MD: No ref. provider found   Chief Complaint  Patient presents with  . 4 week follow up  I'm doing better  History of Present Illness:    Isaiah Spencer is a 51 y.o. male  with poorly controlled diabetes, poorly controlled obstructive sleep apnea, atypical chest pain. He did have echocardiogram which showed preserved left ventricular ejection fraction. He did have a stress test which showed no evidence of ischemia. We spent cradle of time talking about reduction of risk factors for coronary artery disease. We talked about exercises on the radial basis talk about appropriate management of diabetes. He does not have any doctor to take care of his diabetes. I told him that he must have some primary care physician as well as most likely endocrinologist. Now he doesn't have any insurance but his insurance will be available in November. He used to take metformin however discontinued after dose was increased from 500 twice daily to 2 mg a day. He said he lost significant amount of weight and did not want to continue that way I told him he needs to go back to 500 mg twice daily. Check his sugar on the radial basis and he tells been that sugar now is much better because it's only 250 mg  Past Medical History:  Diagnosis Date  . Allergy   . Asthma    AS A CHILD  . Diabetes mellitus without complication ([place])   . GERD (gastroesophageal reflux disease)   . H. pylori infection   . Hemorrhoids   . Hyperlipemia   . OSA (obstructive sleep apnea)    does not use c pap    Past Surgical History:  Procedure Laterality Date  . EXAMINATION UNDER ANESTHESIA  11/21/2011   Procedure: EXAM UNDER ANESTHESIA;  Surgeon: Isaiah Curry, MD;  Location: [facility];  Service: General;  Laterality: N/A;  . HEMORRHOID SURGERY   11/21/2011   Procedure: HEMORRHOIDECTOMY;  Surgeon: Isaiah Curry, MD;  Location: [facility];  Service: General;  Laterality: N/A;  Excisional hemorrhoidectomy times two  . SUPERFICIAL LYMPH NODE BIOPSY / EXCISION  90's   non cancerous    Current Medications: Current Meds  Medication Sig  . aspirin 325 MG tablet Take 325 mg by mouth daily.  Marland Kitchen atorvastatin (LIPITOR) 40 MG tablet Take 1 tablet (40 mg total) by mouth daily.  Marland Kitchen glucose monitoring kit (FREESTYLE) monitoring kit 1 each by Does not apply route as needed for other.  . Lancets (FREESTYLE) lancets Use as instructed     Allergies:   Amoxicillin-pot clavulanate and Penicillins   Social History   Social History  . Marital status: Single    Spouse name: N/A  . Number of children: 2  . Years of education: N/A   Occupational History  . [employer]   Social History Main Topics  . Smoking status: Never Smoker  . Smokeless tobacco: Never Used  . Alcohol use 1.0 oz/week    2 drink(s) per week     Comment: socially  . Drug use: No  . Sexual activity: Not Asked   Other Topics Concern  . None   Social History Narrative   Exercising:  none     Family History: The patient's family history includes Diabetes in his mother;  Heart disease (age of onset: 51) in his mother; Hypertension in his mother. There is no history of Colon cancer, Rectal cancer, Stomach cancer, or Esophageal cancer. ROS:   Please see the history of present illness.    All 14 point review of systems negative except as described per history of present illness  EKGs/Labs/Other Studies Reviewed:      Recent Labs: 05/13/2017: BUN 12; Creatinine, Ser 0.81; Hemoglobin 15.9; Platelets 199; Potassium 3.5; Sodium 136  Recent Lipid Panel    Component Value Date/Time   CHOL 154 03/13/2015 1422   TRIG 111 03/13/2015 1422   HDL 34 (L) 03/13/2015 1422   CHOLHDL 4.5 03/13/2015 1422   VLDL 22 03/13/2015 1422   LDLCALC 98 03/13/2015 1422    Physical Exam:    VS:  BP  108/68   Pulse 76   Resp 12   Ht _0  (1.854 m)   Wt 206 lb 1.9 oz (93.5 kg)   BMI 27.19 kg/m     Wt Readings from Last 3 Encounters:  06/27/17 206 lb 1.9 oz (93.5 kg)  05/20/17 204 lb 6.4 oz (92.7 kg)  03/13/15 218 lb (98.9 kg)     GEN:  Well nourished, well developed in no acute distress HEENT: Normal NECK: No JVD; No carotid bruits LYMPHATICS: No lymphadenopathy CARDIAC: RRR, no murmurs, no rubs, no gallops RESPIRATORY:  Clear to auscultation without rales, wheezing or rhonchi  ABDOMEN: Soft, non-tender, non-distended MUSCULOSKELETAL:  No edema; No deformity  SKIN: Warm and dry LOWER EXTREMITIES: no swelling NEUROLOGIC:  Alert and oriented x 3 PSYCHIATRIC:  Normal affect   ASSESSMENT:    1. Chest discomfort   2. Type 2 diabetes mellitus without complication, without long-term current use of insulin (HCC)    PLAN:    In order of problems listed above:  1. Chest discomfort: So for stress test as well as Cardizem negative. I think however should concentrate on reduction of risk factors for coronary artery disease. And again we talked about exercises good diet as well as diabetic management. 2. Type 2 diabetes: Description as above. 3. Dyslipidemia: He is on high intensity statin which I will continue. 4. Obstructive sleep apnea does not want to test for now. He said he went for insurance.   Medication Adjustments/Labs and Tests Ordered: Current medicines are reviewed at length with the patient today.  Concerns regarding medicines are outlined above.  No orders of the defined types were placed in this encounter.  Medication changes: No orders of the defined types were placed in this encounter.   Signed, Isaiah Liter, MD, [facility] 06/27/2017 3:23 PM    Isaiah Spencer

## 2017-06-27 NOTE — Patient Instructions (Signed)
Medication Instructions:  Your physician recommends that you continue on your current medications as directed. Please refer to the Current Medication list given to you today.  Labwork: None   Testing/Procedures: None   Follow-Up: Your physician recommends that you schedule a follow-up appointment in: 3 months   Any Other Special Instructions Will Be Listed Below (If Applicable).  Please note that any paperwork needing to be filled out by the provider will need to be addressed at the front desk prior to seeing the provider. Please note that any paperwork FMLA, Disability or other documents regarding health condition is subject to a $25.00 charge that must be received prior to completion of paperwork in the form of a money order or check.     If you need a refill on your cardiac medications before your next appointment, please call your pharmacy.  

## 2017-06-27 NOTE — Progress Notes (Signed)
mtfor

## 2017-09-26 ENCOUNTER — Ambulatory Visit: Payer: Self-pay | Admitting: Cardiology

## 2017-09-30 ENCOUNTER — Ambulatory Visit: Payer: Self-pay | Admitting: Cardiology

## 2017-10-02 ENCOUNTER — Ambulatory Visit: Payer: Self-pay | Admitting: Cardiology

## 2018-01-10 ENCOUNTER — Emergency Department (HOSPITAL_COMMUNITY)
Admission: EM | Admit: 2018-01-10 | Discharge: 2018-01-10 | Disposition: A | Discharge status: Home / Self Care | Payer: Self-pay | Attending: Emergency Medicine | Admitting: Emergency Medicine

## 2018-01-10 ENCOUNTER — Encounter (HOSPITAL_COMMUNITY): Payer: Self-pay | Admitting: *Deleted

## 2018-01-10 DIAGNOSIS — J069 Acute upper respiratory infection, unspecified: Secondary | ICD-10-CM | POA: Insufficient documentation

## 2018-01-10 DIAGNOSIS — I10 Essential (primary) hypertension: Secondary | ICD-10-CM | POA: Insufficient documentation

## 2018-01-10 DIAGNOSIS — Z7982 Long term (current) use of aspirin: Secondary | ICD-10-CM | POA: Insufficient documentation

## 2018-01-10 DIAGNOSIS — E119 Type 2 diabetes mellitus without complications: Secondary | ICD-10-CM | POA: Insufficient documentation

## 2018-01-10 DIAGNOSIS — J45909 Unspecified asthma, uncomplicated: Secondary | ICD-10-CM | POA: Insufficient documentation

## 2018-01-10 DIAGNOSIS — Z7984 Long term (current) use of oral hypoglycemic drugs: Secondary | ICD-10-CM | POA: Insufficient documentation

## 2018-01-10 DIAGNOSIS — Z79899 Other long term (current) drug therapy: Secondary | ICD-10-CM | POA: Insufficient documentation

## 2018-01-10 MED ORDER — FLUTICASONE PROPIONATE 50 MCG/ACT NA SUSP: 1.0000 | Freq: Every day | NASAL | 0 refills | Status: DC

## 2018-01-10 MED ORDER — BENZONATATE 100 MG PO CAPS: 100.0000 mg | ORAL_CAPSULE | Freq: Three times a day (TID) | ORAL | 0 refills | Status: DC | PRN

## 2018-01-10 NOTE — ED Triage Notes (Signed)
Pt complains of congestion, sore throat for the past week. Pt states he may have allergies or a cold.

## 2018-01-10 NOTE — ED Provider Notes (Signed)
[place] [facility] DEPT Provider Note   CSN: [id number] Arrival date & time: 01/10/18  0277     History   Chief Complaint Chief Complaint  Patient presents with  . URI    HPI Isaiah Spencer is a 52 y.o. male with a hx of asthma, DM, GERD, and OSA who presents to the ED with complaint of URI sxs for the past 5-7 days. Patient states he is experiencing congestion, rhinorrhea, sore throat, and dry cough. States sore throat is really only if coughing, otherwise it is nonpainful. He has tried phenylephrine with temporary relief and subsequent relapse of his sxs. No other specific alleviating/aggravating factors.  Denies fever, ear pain, sinus pain, dyspnea, or chest pain.   HPI  Past Medical History:  Diagnosis Date  . Allergy   . Asthma    AS A CHILD  . Diabetes mellitus without complication ([place])   . GERD (gastroesophageal reflux disease)   . H. pylori infection   . Hemorrhoids   . Hyperlipemia   . OSA (obstructive sleep apnea)    does not use c pap    Patient Active Problem List   Diagnosis Date Noted  . Chest discomfort 01/21/2014  . OSA (obstructive sleep apnea) 11/02/2012  . Diabetes mellitus, type 2 ([place]) 07/30/2012  . Dysphagia, unspecified(787.20) 12/16/2011  . GERD (gastroesophageal reflux disease) 12/07/2011  . H. pylori infection 12/07/2011  . Internal hemorrhoids 07/10/2011    Past Surgical History:  Procedure Laterality Date  . EXAMINATION UNDER ANESTHESIA  11/21/2011   Procedure: EXAM UNDER ANESTHESIA;  Surgeon: Gayland Curry, MD;  Location: [facility];  Service: General;  Laterality: N/A;  . HEMORRHOID SURGERY  11/21/2011   Procedure: HEMORRHOIDECTOMY;  Surgeon: Gayland Curry, MD;  Location: [facility];  Service: General;  Laterality: N/A;  Excisional hemorrhoidectomy times two  . SUPERFICIAL LYMPH NODE BIOPSY / EXCISION  90's   non cancerous        Home Medications    Prior to Admission medications   Medication Sig Start Date End  Date Taking? Authorizing Provider  aspirin 325 MG tablet Take 325 mg by mouth daily.    [provider]  atorvastatin (LIPITOR) 40 MG tablet Take 1 tablet (40 mg total) by mouth daily. 05/20/17 08/18/17  Park Liter, MD  glucose monitoring kit (FREESTYLE) monitoring kit 1 each by Does not apply route as needed for other. 07/17/14   Shawnee Knapp, MD  Lancets (FREESTYLE) lancets Use as instructed 07/17/14   Shawnee Knapp, MD  metFORMIN (GLUCOPHAGE) 500 MG tablet Take 1 tablet (500 mg total) by mouth 2 (two) times daily with a meal. 06/27/17   Park Liter, MD    Family History Family History  Problem Relation Age of Onset  . Heart disease Mother 54       heart attack   . Diabetes Mother   . Hypertension Mother   . Colon cancer Neg Hx   . Rectal cancer Neg Hx   . Stomach cancer Neg Hx   . Esophageal cancer Neg Hx     Social History Social History   Tobacco Use  . Smoking status: Never Smoker  . Smokeless tobacco: Never Used  Substance Use Topics  . Alcohol use: Yes    Alcohol/week: 1.0 oz    Types: 2 drink(s) per week    Comment: socially  . Drug use: No     Allergies   Amoxicillin-pot clavulanate and Penicillins   Review  of Systems Review of Systems  Constitutional: Negative for chills and fever.  HENT: Positive for congestion and sore throat. Negative for ear pain, sinus pain, trouble swallowing and voice change.   Respiratory: Positive for cough. Negative for shortness of breath.   Cardiovascular: Negative for chest pain.   Physical Exam Updated Vital Signs BP (!) 150/90 (BP Location: Left Arm)   Pulse 94   Temp 98.1 F (36.7 C) (Oral)   Resp 18   SpO2 94%   Physical Exam  Constitutional: He appears well-developed and well-nourished.  Non-toxic appearance. No distress.  HENT:  Head: Normocephalic and atraumatic.  Right Ear: Tympanic membrane is not perforated, not erythematous, not retracted and not bulging.  Left Ear: Tympanic membrane is  not perforated, not erythematous, not retracted and not bulging.  Nose: Mucosal edema (and congestion) present. Right sinus exhibits no maxillary sinus tenderness and no frontal sinus tenderness. Left sinus exhibits no maxillary sinus tenderness and no frontal sinus tenderness.  Mouth/Throat: Uvula is midline and oropharynx is clear and moist. No oropharyngeal exudate.  Eyes: Pupils are equal, round, and reactive to light. Conjunctivae are normal. Right eye exhibits no discharge. Left eye exhibits no discharge.  Neck: Normal range of motion. Neck supple.  Cardiovascular: Normal rate and regular rhythm.  No murmur heard. Pulmonary/Chest: Effort normal and breath sounds normal. No respiratory distress. He has no wheezes. He has no rales.  Lymphadenopathy:    He has no cervical adenopathy.  Neurological: He is alert.  Clear speech.   Psychiatric: He has a normal mood and affect. His behavior is normal. Thought content normal.  Nursing note and vitals reviewed.  ED Treatments / Results  Labs (all labs ordered are listed, but only abnormal results are displayed) Labs Reviewed - No data to display  EKG None  Radiology No results found.  Procedures Procedures (including critical care time)  Medications Ordered in ED Medications - No data to display   Initial Impression / Assessment and Plan / ED Course  I have reviewed the triage vital signs and the nursing notes.  Pertinent labs & imaging results that were available during my care of the patient were reviewed by me and considered in my medical decision making (see chart for details).    Patient presents with symptoms consistent with viral illness. He is nontoxic appearing, vitals WNL other than elevated BP- no indication of HTN emergency, patient aware of need for recheck. Patient is afebrile and without adventitious sounds on lung exam, doubt PNA. No wheezing on exam. Afebrile, no sinus tenderness, doubt sinusitis. Centor score 0,  doubt strep pharyngitis. Suspect viral etiology, will treat supportively with Flonase, and Tessalon, recommended discontinuation of phenylephrine spray. I discussed treatment plan, need for PCP follow-up, and return precautions with the patient. Provided opportunity for questions, patient confirmed understanding and is in agreement with plan.   Final Clinical Impressions(s) / ED Diagnoses   Final diagnoses:  Viral upper respiratory tract infection    ED Discharge Orders        Ordered    fluticasone (FLONASE) 50 MCG/ACT nasal spray  Daily     01/10/18 0751    benzonatate (TESSALON) 100 MG capsule  3 times daily PRN     01/10/18 0751       Ramonia Mcclaran, Glynda Jaeger, PA-C 01/10/18 0800    Lajean Saver, MD 01/10/18 1540

## 2018-01-10 NOTE — Discharge Instructions (Signed)
You were seen in the emergency today and diagnosed with a viral illness.  I have prescribed you medications to treat your symptoms.   -Flonase to be used 1 spray in each nostril daily.  This medication is used to treat your congestion.  -Tessalon can be taken once every 8 hours as needed.  This medication is used to treat your cough.  Stop using the phenylephrine spray in your nose as this can eventually worsen your symptoms with continued use. Discuss the medications prescribed today with your pharmacist as they can have adverse effects and interactions with your other medicines including over the counter and prescribed medications. Seek medical evaluation if you start to experience new or abnormal symptoms after taking one of these medicines, seek care immediately if you start to experience difficulty breathing, feeling of your throat closing, facial swelling, or rash as these could be indications of a more serious allergic reaction. You will need to follow-up with your primary care provider in 1 week if your symptoms have not improved.  If you do not have a primary care provider one is provided in your discharge instructions.  Return to the emergency department for any new or worsening symptoms including but not limited to persistent fever, difficulty breathing, chest pain, passing out, inability to swallow or any other concerns you may have.  Additional Information:  Your vital signs today were: BP (!) 150/90 (BP Location: Left Arm)    Pulse 94    Temp 98.1 F (36.7 C) (Oral)    Resp 18    SpO2 94%  If your blood pressure (BP) was elevated above 135/85 this visit, please have this repeated by your doctor within one month. ---------------

## 2018-01-16 ENCOUNTER — Other Ambulatory Visit: Payer: Self-pay

## 2018-01-16 ENCOUNTER — Emergency Department (HOSPITAL_COMMUNITY)
Admission: EM | Admit: 2018-01-16 | Discharge: 2018-01-16 | Disposition: A | Discharge status: Home / Self Care | Payer: Self-pay | Attending: Emergency Medicine | Admitting: Emergency Medicine

## 2018-01-16 DIAGNOSIS — J0100 Acute maxillary sinusitis, unspecified: Secondary | ICD-10-CM

## 2018-01-16 DIAGNOSIS — Z7984 Long term (current) use of oral hypoglycemic drugs: Secondary | ICD-10-CM | POA: Insufficient documentation

## 2018-01-16 DIAGNOSIS — E119 Type 2 diabetes mellitus without complications: Secondary | ICD-10-CM | POA: Insufficient documentation

## 2018-01-16 DIAGNOSIS — R69 Illness, unspecified: Secondary | ICD-10-CM

## 2018-01-16 DIAGNOSIS — Z7982 Long term (current) use of aspirin: Secondary | ICD-10-CM | POA: Insufficient documentation

## 2018-01-16 DIAGNOSIS — J111 Influenza due to unidentified influenza virus with other respiratory manifestations: Secondary | ICD-10-CM | POA: Insufficient documentation

## 2018-01-16 MED ORDER — OSELTAMIVIR PHOSPHATE 75 MG PO CAPS: 75.0000 mg | ORAL_CAPSULE | Freq: Two times a day (BID) | ORAL | 0 refills | Status: DC

## 2018-01-16 MED ORDER — HYDROCODONE-ACETAMINOPHEN 5-325 MG PO TABS
1.0000 | ORAL_TABLET | Freq: Once | ORAL | Status: AC
  Administered 2018-01-16: 1 via ORAL
  Filled 2018-01-16: qty 1

## 2018-01-16 MED ORDER — IBUPROFEN 200 MG PO TABS
400.0000 mg | ORAL_TABLET | Freq: Once | ORAL | Status: AC
Start: 2018-01-16 — End: 2018-01-16
  Administered 2018-01-16: 400 mg via ORAL
  Filled 2018-01-16: qty 2

## 2018-01-16 MED ORDER — HYDROCODONE-ACETAMINOPHEN 5-325 MG PO TABS: ORAL_TABLET | ORAL | 0 refills | Status: DC

## 2018-01-16 MED ORDER — DOXYCYCLINE HYCLATE 100 MG PO CAPS: 100.0000 mg | ORAL_CAPSULE | Freq: Two times a day (BID) | ORAL | 0 refills | Status: DC

## 2018-01-16 MED ORDER — OSELTAMIVIR PHOSPHATE 75 MG PO CAPS
75.0000 mg | ORAL_CAPSULE | Freq: Once | ORAL | Status: AC
Start: 2018-01-16 — End: 2018-01-16
  Administered 2018-01-16: 75 mg via ORAL
  Filled 2018-01-16: qty 1

## 2018-01-16 MED ORDER — DOXYCYCLINE HYCLATE 100 MG PO TABS
100.0000 mg | ORAL_TABLET | Freq: Once | ORAL | Status: AC
  Administered 2018-01-16: 100 mg via ORAL
  Filled 2018-01-16: qty 1

## 2018-01-16 NOTE — ED Provider Notes (Signed)
[place] [facility] DEPT Provider Note   CSN: [id number] Arrival date & time: 01/16/18  2000     History   Chief Complaint Chief Complaint  Patient presents with  . Sore Throat  . Generalized Body Aches    HPI   .Blood pressure 138/79, pulse 100, temperature (!) 101.2 F (38.4 C), temperature source Oral, resp. rate 18, height '6\' 1"'$  (1.854 m), weight 92.2 kg (203 lb 4 oz), SpO2 96 %.  Isaiah Spencer is a 52 y.o. male with past medical history significant for non-insulin-dependent diabetes, hyperlipidemia complaining of sore throat sinus pressure and pain worsening over the course of 2 weeks.  He developed fever over the course the last 48 hours with headache, myalgia, cough.  He denies any chest pain, shortness of breath, cough is dry.  No nausea, vomiting, change in bowel or bladder habits.  Multiple sick contacts at work.  He did not get a flu shot this year.  Past Medical History:  Diagnosis Date  . Allergy   . Asthma    AS A CHILD  . Diabetes mellitus without complication ([place])   . GERD (gastroesophageal reflux disease)   . H. pylori infection   . Hemorrhoids   . Hyperlipemia   . OSA (obstructive sleep apnea)    does not use c pap    Patient Active Problem List   Diagnosis Date Noted  . Chest discomfort 01/21/2014  . OSA (obstructive sleep apnea) 11/02/2012  . Diabetes mellitus, type 2 ([place]) 07/30/2012  . Dysphagia, unspecified(787.20) 12/16/2011  . GERD (gastroesophageal reflux disease) 12/07/2011  . H. pylori infection 12/07/2011  . Internal hemorrhoids 07/10/2011    Past Surgical History:  Procedure Laterality Date  . EXAMINATION UNDER ANESTHESIA  11/21/2011   Procedure: EXAM UNDER ANESTHESIA;  Surgeon: Gayland Curry, MD;  Location: [facility];  Service: General;  Laterality: N/A;  . HEMORRHOID SURGERY  11/21/2011   Procedure: HEMORRHOIDECTOMY;  Surgeon: Gayland Curry, MD;  Location: [facility];  Service: General;  Laterality: N/A;  Excisional  hemorrhoidectomy times two  . SUPERFICIAL LYMPH NODE BIOPSY / EXCISION  90's   non cancerous        Home Medications    Prior to Admission medications   Medication Sig Start Date End Date Taking? Authorizing Provider  aspirin 325 MG tablet Take 325 mg by mouth daily.    [provider]  atorvastatin (LIPITOR) 40 MG tablet Take 1 tablet (40 mg total) by mouth daily. 05/20/17 08/18/17  Park Liter, MD  benzonatate (TESSALON) 100 MG capsule Take 1 capsule (100 mg total) by mouth 3 (three) times daily as needed for cough. 01/10/18   Petrucelli, Samantha R, PA-C  fluticasone (FLONASE) 50 MCG/ACT nasal spray Place 1 spray into both nostrils daily. 01/10/18   Petrucelli, Samantha R, PA-C  glucose monitoring kit (FREESTYLE) monitoring kit 1 each by Does not apply route as needed for other. 07/17/14   Shawnee Knapp, MD  Lancets (FREESTYLE) lancets Use as instructed 07/17/14   Shawnee Knapp, MD  metFORMIN (GLUCOPHAGE) 500 MG tablet Take 1 tablet (500 mg total) by mouth 2 (two) times daily with a meal. 06/27/17   Park Liter, MD    Family History Family History  Problem Relation Age of Onset  . Heart disease Mother 85       heart attack   . Diabetes Mother   . Hypertension Mother   . Colon cancer Neg Hx   . Rectal  cancer Neg Hx   . Stomach cancer Neg Hx   . Esophageal cancer Neg Hx     Social History Social History   Tobacco Use  . Smoking status: Never Smoker  . Smokeless tobacco: Never Used  Substance Use Topics  . Alcohol use: Yes    Alcohol/week: 1.0 oz    Types: 2 drink(s) per week    Comment: socially  . Drug use: No     Allergies   Amoxicillin-pot clavulanate and Penicillins   Review of Systems Review of Systems  A complete review of systems was obtained and all systems are negative except as noted in the HPI and PMH.   Physical Exam Updated Vital Signs BP 138/79 (BP Location: Right Arm)   Pulse 100   Temp (!) 101.2 F (38.4 C) (Oral)   Resp  18   Ht '6\' 1"'$  (1.854 m)   Wt 92.2 kg (203 lb 4 oz)   SpO2 96%   BMI 26.82 kg/m   Physical Exam  Constitutional: He appears well-developed and well-nourished.  HENT:  Head: Normocephalic.  Right Ear: External ear normal.  Left Ear: External ear normal.  Mouth/Throat: Oropharynx is clear and moist. No oropharyngeal exudate.  No drooling or stridor. Posterior pharynx mildly erythematous no significant tonsillar hypertrophy. No exudate. Soft palate rises symmetrically. No TTP or induration under tongue.   + Maxillary sinus pressure and pain with palpation.  Mild mucosal edema in the nares with scant rhinorrhea.  Bilateral tympanic membranes with normal architecture and good light reflex.    Eyes: Pupils are equal, round, and reactive to light. Conjunctivae and EOM are normal.  Neck: Normal range of motion. Neck supple.  Cardiovascular: Normal rate and regular rhythm.  Pulmonary/Chest: Effort normal and breath sounds normal. No stridor. No respiratory distress. He has no wheezes. He has no rales. He exhibits no tenderness.  Abdominal: Soft. There is no tenderness. There is no rebound and no guarding.  Nursing note and vitals reviewed.    ED Treatments / Results  Labs (all labs ordered are listed, but only abnormal results are displayed) Labs Reviewed - No data to display  EKG None  Radiology No results found.  Procedures Procedures (including critical care time)  Medications Ordered in ED Medications  ibuprofen (ADVIL,MOTRIN) tablet 400 mg (has no administration in time range)  oseltamivir (TAMIFLU) capsule 75 mg (has no administration in time range)  doxycycline (VIBRA-TABS) tablet 100 mg (has no administration in time range)  HYDROcodone-acetaminophen (NORCO/VICODIN) 5-325 MG per tablet 1 tablet (has no administration in time range)     Initial Impression / Assessment and Plan / ED Course  I have reviewed the triage vital signs and the nursing notes.  Pertinent  labs & imaging results that were available during my care of the patient were reviewed by me and considered in my medical decision making (see chart for details).    Vitals:   01/16/18 2024 01/16/18 2029  BP: 138/79   Pulse: 100   Resp: 18   Temp: (!) 101.2 F (38.4 C)   TempSrc: Oral   SpO2: 96%   Weight:  92.2 kg (203 lb 4 oz)  Height:  '6\' 1"'$  (1.854 m)    Medications  ibuprofen (ADVIL,MOTRIN) tablet 400 mg (has no administration in time range)  oseltamivir (TAMIFLU) capsule 75 mg (has no administration in time range)  doxycycline (VIBRA-TABS) tablet 100 mg (has no administration in time range)  HYDROcodone-acetaminophen (NORCO/VICODIN) 5-325 MG per tablet 1 tablet (has  no administration in time range)    Isaiah Spencer is 52 y.o. male presenting with worsening sinus pressure and pain over the course of 2 weeks with associated sure throat.  He also developed fever, myalgia, headache over the last 24-48 hours.  Likely superinfected with flu.  Lung sounds clear, he saturating well on room air.  No reported chest pain or shortness of breath, doubt pneumonia.  Patient is a non-insulin-dependent diabetic, will treat sinus infection with doxycycline as he is allergic to amoxicillin, Tamiflu initiated.  Counseled this patient on critical importance of hydration, antipyretics.  Extensive discussion of return precaution patient and his wife verbalized understanding and teach back technique.  Evaluation does not show pathology that would require ongoing emergent intervention or inpatient treatment. Pt is hemodynamically stable and mentating appropriately. Discussed findings and plan with patient/guardian, who agrees with care plan. All questions answered. Return precautions discussed and outpatient follow up given.      Final Clinical Impressions(s) / ED Diagnoses   Final diagnoses:  None    ED Discharge Orders    None       Jacody Beneke, Charna Elizabeth 01/16/18 2131    Dorie Rank,  MD 01/16/18 [phone]

## 2018-01-16 NOTE — ED Triage Notes (Signed)
Pt reports congestion, sore throat, chills, and body aches. Pt also reports a tooth that is cracked and possible infected.

## 2018-01-16 NOTE — Discharge Instructions (Signed)
Return to the emergency room for any worsening or concerning symptoms including fast breathing, heart racing, confusion, vomiting. ° °Rest, cover your mouth when you cough and wash your hands frequently.  ° °Push fluids: water or Gatorade, do not drink any soda, juice or caffeinated beverages. ° °For fever and pain control you can take Motrin (ibuprofen) as follows: 400 mg (this is normally 2 over the counter pills) every 4 hours with food. ° °Do not return to work or school until a 48 hours after your fever breaks.  ° °Take Vicodin for cough and pain control, do not drink alcohol, drive, care for children or do other critical tasks while taking Vicodin    °

## 2018-04-22 ENCOUNTER — Other Ambulatory Visit: Payer: Self-pay

## 2018-04-22 MED ORDER — ATORVASTATIN CALCIUM 40 MG PO TABS: 40.0000 mg | ORAL_TABLET | Freq: Every day | ORAL | 0 refills | Status: DC

## 2018-04-29 ENCOUNTER — Emergency Department (HOSPITAL_COMMUNITY): Payer: Self-pay

## 2018-04-29 ENCOUNTER — Encounter (HOSPITAL_COMMUNITY): Payer: Self-pay

## 2018-04-29 ENCOUNTER — Emergency Department (HOSPITAL_COMMUNITY)
Admission: EM | Admit: 2018-04-29 | Discharge: 2018-04-29 | Disposition: A | Discharge status: Home / Self Care | Payer: Self-pay | Attending: Emergency Medicine | Admitting: Emergency Medicine

## 2018-04-29 ENCOUNTER — Other Ambulatory Visit: Payer: Self-pay

## 2018-04-29 DIAGNOSIS — Z79899 Other long term (current) drug therapy: Secondary | ICD-10-CM | POA: Insufficient documentation

## 2018-04-29 DIAGNOSIS — E119 Type 2 diabetes mellitus without complications: Secondary | ICD-10-CM | POA: Insufficient documentation

## 2018-04-29 DIAGNOSIS — Z7984 Long term (current) use of oral hypoglycemic drugs: Secondary | ICD-10-CM | POA: Insufficient documentation

## 2018-04-29 DIAGNOSIS — Z7982 Long term (current) use of aspirin: Secondary | ICD-10-CM | POA: Insufficient documentation

## 2018-04-29 DIAGNOSIS — M5416 Radiculopathy, lumbar region: Secondary | ICD-10-CM | POA: Insufficient documentation

## 2018-04-29 MED ORDER — IBUPROFEN 200 MG PO TABS
600.0000 mg | ORAL_TABLET | Freq: Once | ORAL | Status: AC
  Administered 2018-04-29: 600 mg via ORAL
  Filled 2018-04-29: qty 3

## 2018-04-29 MED ORDER — HYDROCODONE-ACETAMINOPHEN 5-325 MG PO TABS
1.0000 | ORAL_TABLET | Freq: Once | ORAL | Status: DC
  Filled 2018-04-29: qty 1

## 2018-04-29 MED ORDER — DIAZEPAM 5 MG PO TABS
5.0000 mg | ORAL_TABLET | Freq: Once | ORAL | Status: DC
  Filled 2018-04-29: qty 1

## 2018-04-29 MED ORDER — METHOCARBAMOL 500 MG PO TABS: 500.0000 mg | ORAL_TABLET | Freq: Two times a day (BID) | ORAL | 0 refills | Status: DC

## 2018-04-29 MED ORDER — IBUPROFEN 600 MG PO TABS: 600.0000 mg | ORAL_TABLET | Freq: Four times a day (QID) | ORAL | 0 refills | Status: DC | PRN

## 2018-04-29 MED ORDER — ACETAMINOPHEN ER 650 MG PO TBCR: 650.0000 mg | EXTENDED_RELEASE_TABLET | Freq: Three times a day (TID) | ORAL | 0 refills | Status: DC | PRN

## 2018-04-29 NOTE — ED Provider Notes (Signed)
[facility] DEPT Provider Note   CSN: [id number] Arrival date & time: 04/29/18  5188     History   Chief Complaint Chief Complaint  Patient presents with  . Back Pain    HPI Deran D Martinique is a 52 y.o. male.  HPI 52 year old male with history of diabetes comes in with chief complaint of back pain. Patient reports that he was pushing a vehicle 2 days ago and heard a pop followed by sudden back pain.  Since then patient's back pain has persisted, and therefore he decided to come to the ER.  Patient's back pain is located in the lower lumbar region, and there is radiation of the pain down his left lower extremity. Pt has no associated numbness, weakness, urinary incontinence, urinary retention, bowel incontinence, pins and needle sensation in the perineal area.   Past Medical History:  Diagnosis Date  . Allergy   . Asthma    AS A CHILD  . Diabetes mellitus without complication ([place])   . GERD (gastroesophageal reflux disease)   . H. pylori infection   . Hemorrhoids   . Hyperlipemia   . OSA (obstructive sleep apnea)    does not use c pap    Patient Active Problem List   Diagnosis Date Noted  . Chest discomfort 01/21/2014  . OSA (obstructive sleep apnea) 11/02/2012  . Diabetes mellitus, type 2 ([place]) 07/30/2012  . Dysphagia, unspecified(787.20) 12/16/2011  . GERD (gastroesophageal reflux disease) 12/07/2011  . H. pylori infection 12/07/2011  . Internal hemorrhoids 07/10/2011    Past Surgical History:  Procedure Laterality Date  . EXAMINATION UNDER ANESTHESIA  11/21/2011   Procedure: EXAM UNDER ANESTHESIA;  Surgeon: Gayland Curry, MD;  Location: [facility];  Service: General;  Laterality: N/A;  . HEMORRHOID SURGERY  11/21/2011   Procedure: HEMORRHOIDECTOMY;  Surgeon: Gayland Curry, MD;  Location: [facility];  Service: General;  Laterality: N/A;  Excisional hemorrhoidectomy times two  . SUPERFICIAL LYMPH NODE BIOPSY / EXCISION  90's   non cancerous         Home Medications    Prior to Admission medications   Medication Sig Start Date End Date Taking? Authorizing Provider  aspirin 325 MG tablet Take 325 mg by mouth daily.   Yes [provider]  atorvastatin (LIPITOR) 40 MG tablet Take 1 tablet (40 mg total) by mouth daily. 04/22/18 07/21/18 Yes Park Liter, MD  esomeprazole (NEXIUM) 20 MG capsule Take 20 mg by mouth daily at 12 noon.   Yes [provider]  glucose monitoring kit (FREESTYLE) monitoring kit 1 each by Does not apply route as needed for other. 07/17/14  Yes Shawnee Knapp, MD  Lancets (FREESTYLE) lancets Use as instructed 07/17/14  Yes Shawnee Knapp, MD  metFORMIN (GLUCOPHAGE) 500 MG tablet Take 1 tablet (500 mg total) by mouth 2 (two) times daily with a meal. 06/27/17  Yes Park Liter, MD  Multiple Vitamins-Minerals (MULTIVITAMIN ADULT PO) Take 1 tablet by mouth daily.   Yes [provider]  acetaminophen (TYLENOL 8 HOUR) 650 MG CR tablet Take 1 tablet (650 mg total) by mouth every 8 (eight) hours as needed. 04/29/18   Varney Biles, MD  benzonatate (TESSALON) 100 MG capsule Take 1 capsule (100 mg total) by mouth 3 (three) times daily as needed for cough. Patient not taking: Reported on 04/29/2018 01/10/18   Petrucelli, Glynda Jaeger, PA-C  doxycycline (VIBRAMYCIN) 100 MG capsule Take 1 capsule (100 mg total) by mouth 2 (  two) times daily. Patient not taking: Reported on 04/29/2018 01/16/18   Pisciotta, Elmyra Ricks, PA-C  fluticasone [facility]) 50 MCG/ACT nasal spray Place 1 spray into both nostrils daily. Patient not taking: Reported on 04/29/2018 01/10/18   Petrucelli, Glynda Jaeger, PA-C  HYDROcodone-acetaminophen (NORCO/VICODIN) 5-325 MG tablet Take 1-2 tablets by mouth every 6 hours as needed for pain and/or cough. Patient not taking: Reported on 04/29/2018 01/16/18   Pisciotta, Elmyra Ricks, PA-C  ibuprofen (ADVIL,MOTRIN) 600 MG tablet Take 1 tablet (600 mg total) by mouth every 6 (six) hours as needed. 04/29/18    Varney Biles, MD  methocarbamol (ROBAXIN) 500 MG tablet Take 1 tablet (500 mg total) by mouth 2 (two) times daily. 04/29/18   Varney Biles, MD  oseltamivir (TAMIFLU) 75 MG capsule Take 1 capsule (75 mg total) by mouth every 12 (twelve) hours. Patient not taking: Reported on 04/29/2018 01/16/18   Pisciotta, Elmyra Ricks, PA-C    Family History Family History  Problem Relation Age of Onset  . Heart disease Mother 6       heart attack   . Diabetes Mother   . Hypertension Mother   . Colon cancer Neg Hx   . Rectal cancer Neg Hx   . Stomach cancer Neg Hx   . Esophageal cancer Neg Hx     Social History Social History   Tobacco Use  . Smoking status: Never Smoker  . Smokeless tobacco: Never Used  Substance Use Topics  . Alcohol use: Yes    Alcohol/week: 1.2 oz    Types: 2 drink(s) per week    Comment: socially  . Drug use: No     Allergies   Amoxicillin-pot clavulanate and Penicillins   Review of Systems Review of Systems  Constitutional: Positive for activity change.  Genitourinary: Negative for difficulty urinating.  Musculoskeletal: Positive for back pain.  Allergic/Immunologic: Negative for immunocompromised state.  Neurological: Negative for numbness.     Physical Exam Updated Vital Signs BP 126/87   Pulse 64   Temp 98.1 F (36.7 C) (Oral)   Resp 18   Ht '6\' 1"'$  (1.854 m)   Wt 90.9 kg (200 lb 6 oz)   SpO2 98%   BMI 26.44 kg/m   Physical Exam  Constitutional: He is oriented to person, place, and time. He appears well-developed.  HENT:  Head: Atraumatic.  Neck: Neck supple.  Cardiovascular: Normal rate.  Pulmonary/Chest: Effort normal.  Abdominal: Soft.  Musculoskeletal:  Pt has tenderness over the lumbar region No step offs, no erythema. Pt has 1+ patellar reflex bilaterally. Able to discriminate between sharp and dull. Able to ambulate Negative passive leg raise   Neurological: He is alert and oriented to person, place, and time.  Skin: Skin is  warm.  Nursing note and vitals reviewed.    ED Treatments / Results  Labs (all labs ordered are listed, but only abnormal results are displayed) Labs Reviewed - No data to display  EKG None  Radiology Dg Lumbar Spine Complete  Result Date: 04/29/2018 CLINICAL DATA:  Lumbago with left-sided radicular symptoms EXAM: LUMBAR SPINE - COMPLETE 4+ VIEW COMPARISON:  None. FINDINGS: Frontal, lateral, spot lumbosacral lateral, and bilateral oblique views were obtained. The there are 5 non-rib-bearing lumbar type vertebral bodies. There is no fracture or spondylolisthesis. There is mild disc space narrowing at L4-5 and L5-S1. Other disc spaces appear normal. There are small anterior osteophytes at L1, L2, L4, and L5. There is facet osteoarthritic change at L5-S1 bilaterally. IMPRESSION: There are areas of relatively mild  osteoarthritic change. No fracture or spondylolisthesis. Electronically Signed   By: Lowella Grip III M.D.   On: 04/29/2018 10:52    Procedures Procedures (including critical care time)  Medications Ordered in ED Medications  diazepam (VALIUM) tablet 5 mg (5 mg Oral Refused 04/29/18 1008)  HYDROcodone-acetaminophen (NORCO/VICODIN) 5-325 MG per tablet 1 tablet (1 tablet Oral Refused 04/29/18 1008)  ibuprofen (ADVIL,MOTRIN) tablet 600 mg (600 mg Oral Given 04/29/18 1001)     Initial Impression / Assessment and Plan / ED Course  I have reviewed the triage vital signs and the nursing notes.  Pertinent labs & imaging results that were available during my care of the patient were reviewed by me and considered in my medical decision making (see chart for details).     52 year old comes in with chief complaint of back pain.  DDx includes: - DJD of the back - Spondylitises/ spondylosis - Sciatica - Musculoskeletal pain -Lumbar radiculopathy  52 year old comes in with chief complaint of back pain.  Patient is having pain radiating down his left leg, otherwise no red flags  on history or exam suggesting of cord compression or conus medullaris.  Given that the pain was evoked by lifting/exertion, we will get an x-ray to make sure there is no significant alignment issues that would be concerning for severely herniated disc.    Final Clinical Impressions(s) / ED Diagnoses   Final diagnoses:  Lumbar radiculopathy    ED Discharge Orders        Ordered    ibuprofen (ADVIL,MOTRIN) 600 MG tablet  Every 6 hours PRN     04/29/18 1127    acetaminophen (TYLENOL 8 HOUR) 650 MG CR tablet  Every 8 hours PRN     04/29/18 1127    methocarbamol (ROBAXIN) 500 MG tablet  2 times daily     04/29/18 [place], Leyda Vanderwerf, MD 04/29/18 1145

## 2018-04-29 NOTE — Discharge Instructions (Signed)
We saw you in the ER for back pain. The imaging in the exam is normal, and our exam don't indicate any spinal cord involvement - and thus we feel comfortable sending you home. Please take the ibuprofen every 6 hours for the next 3 days, take the muscle relaxant as needed, and see your primary care doctor for further pain control.  Please use the back exercises to strengthen the back muscles.

## 2018-04-29 NOTE — ED Triage Notes (Addendum)
Patient c/o left lower back pain with radiating pain down the lft leg that is worse with ambulation x 2 days. Patient states he was pushing a [employer] and felt a pop in his left lower back.

## 2018-04-29 NOTE — ED Notes (Signed)
Patient ambulated to X-ray 

## 2018-05-28 ENCOUNTER — Telehealth: Payer: Self-pay | Admitting: Cardiology

## 2018-05-28 NOTE — Telephone Encounter (Signed)
Bobbi called to check on refills for atorvastatin (LIPITOR) 40 MG tablet for  Isaiah Spencer, advised that he needs to schedule an appointment before getting any refills

## 2018-07-06 IMAGING — CR DG CHEST 2V
2 series · 2 of 2 positions shown · non-contrast
Comparison: 12/27/2013

CLINICAL DATA: Chest pain

EXAM:
CHEST  2 VIEW

[w chest pa]
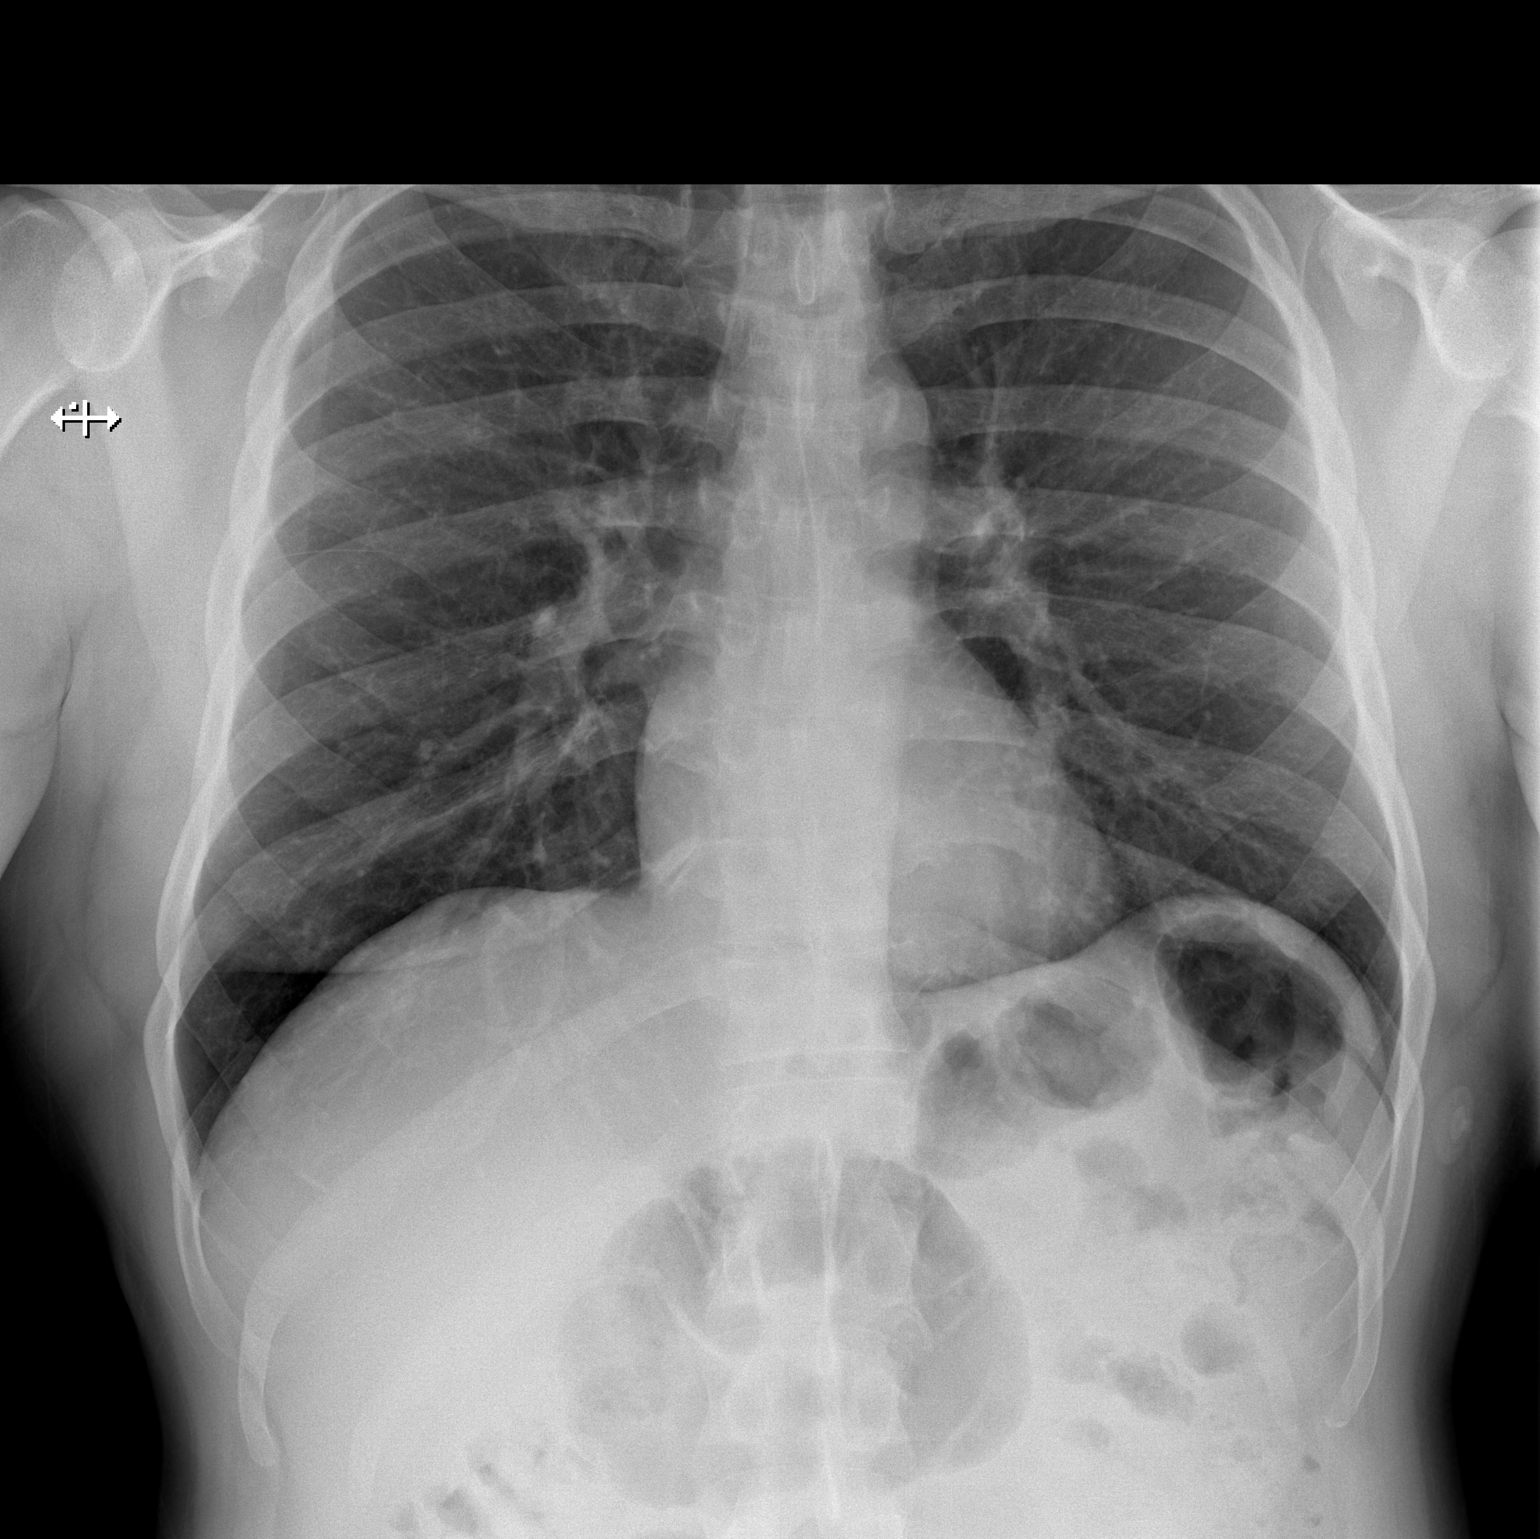

[w chest lat]
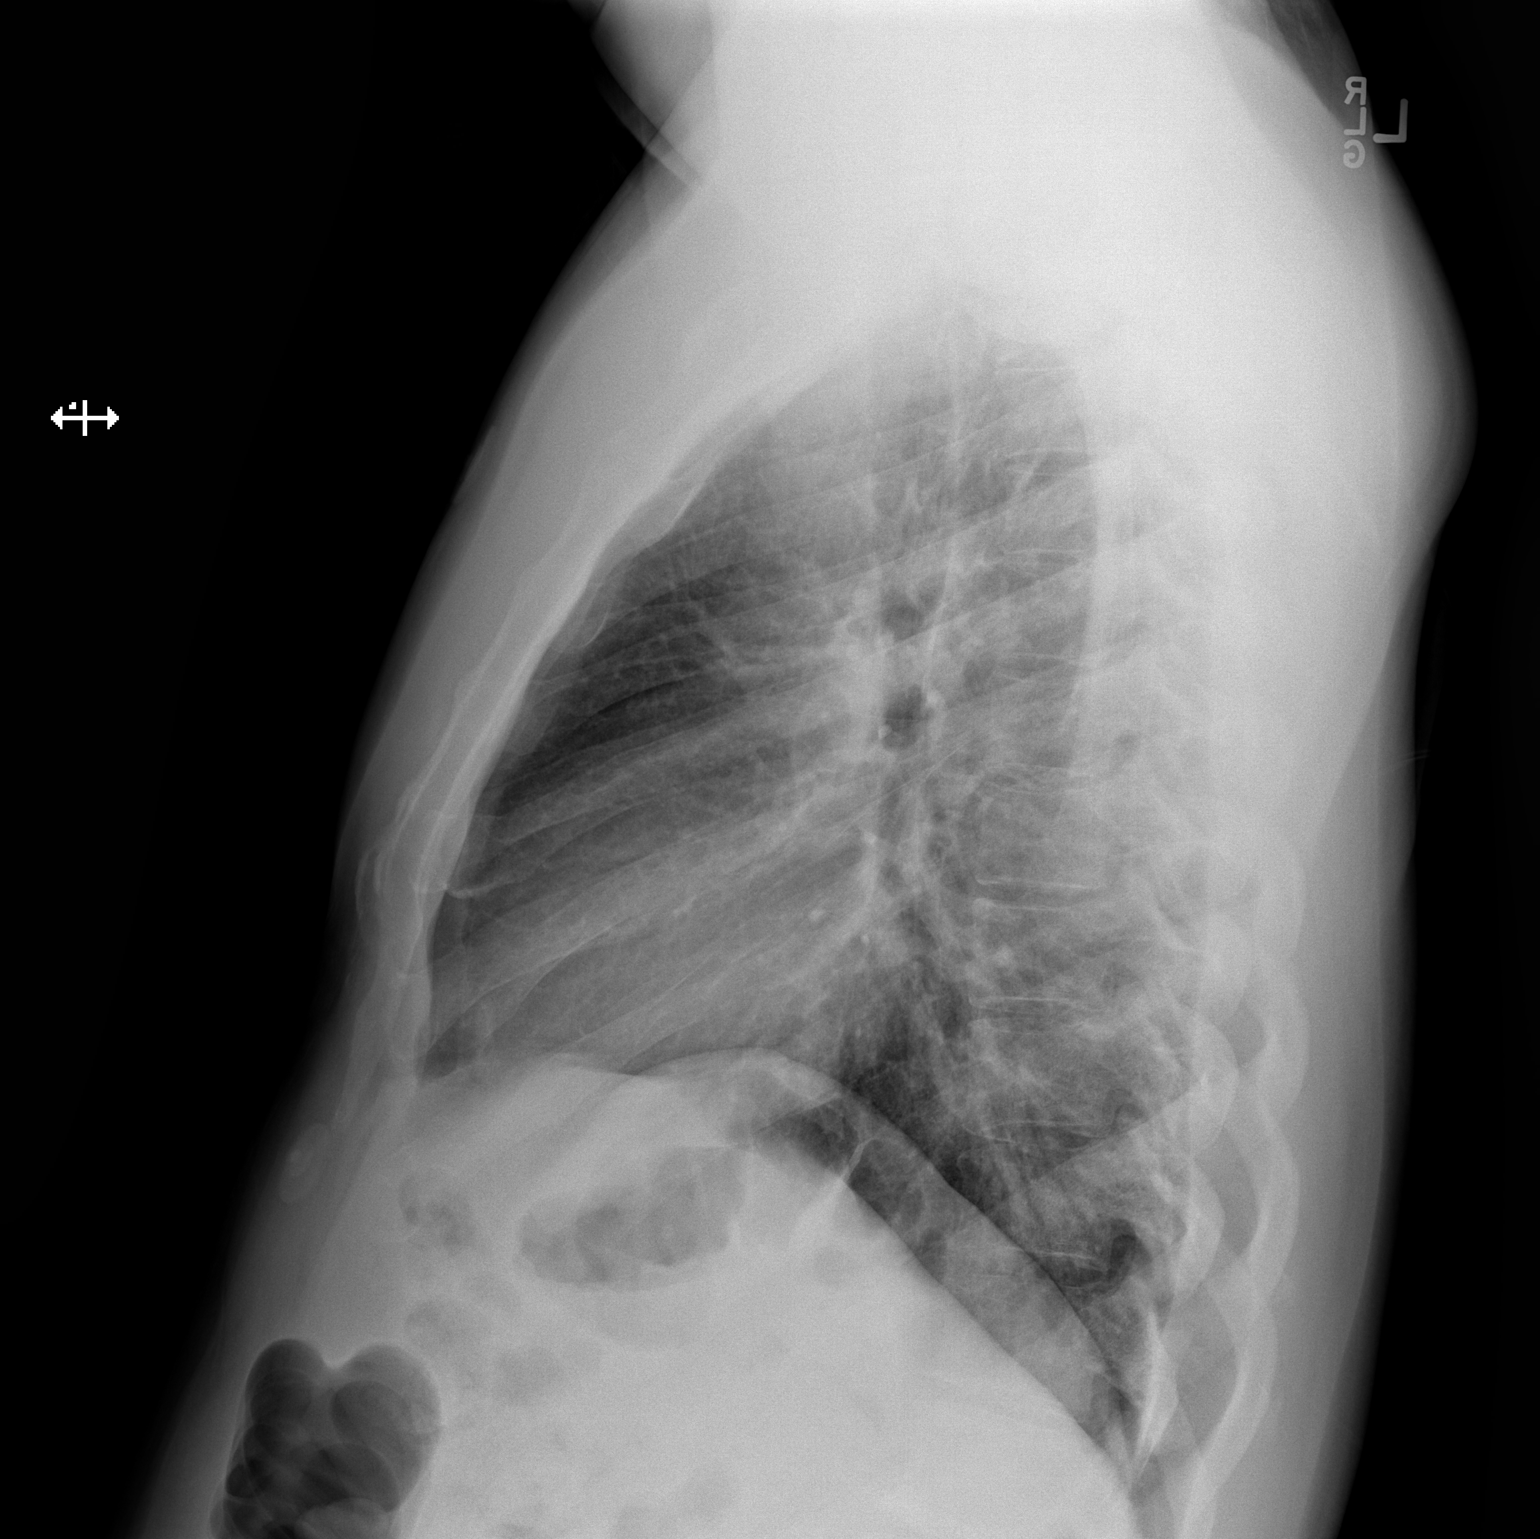

[2 of 2 positions shown; findings below may reference images not displayed]

FINDINGS: The heart size and mediastinal contours are within normal limits.
Both lungs are clear. The visualized skeletal structures are
unremarkable.
IMPRESSION: No active cardiopulmonary disease.

## 2018-07-20 IMAGING — NM NM MISC PROCEDURE
6 series · 36 of 36 positions shown · non-contrast
Comparison: none

[Series 1: rest · 6.40mm/px · 6 of 64 frames shown]
[frame 6/64]
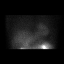
[frame 16/64]
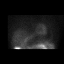
[frame 27/64]
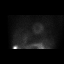
[frame 38/64]
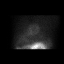
[frame 48/64]
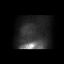
[frame 59/64]
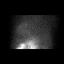

[Series 1: wbr_r-proj_st rest · 6.40mm/px · 6 of 64 frames shown]
[frame 6/64]
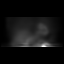
[frame 16/64]
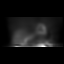
[frame 27/64]
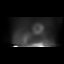
[frame 38/64]
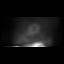
[frame 48/64]
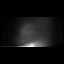
[frame 59/64]
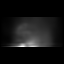

[Series 1: wbr_s-proj_st stress-sum-em · 6.40mm/px · 6 of 64 frames shown]
[frame 6/64]
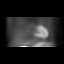
[frame 16/64]
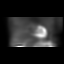
[frame 27/64]
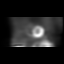
[frame 38/64]
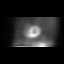
[frame 48/64]
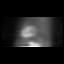
[frame 59/64]
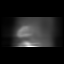

[Series 1: stress-sum-em · 6.40mm/px · 6 of 64 frames shown]
[frame 6/64]
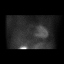
[frame 16/64]
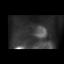
[frame 27/64]
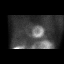
[frame 38/64]
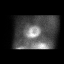
[frame 48/64]
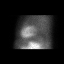
[frame 59/64]
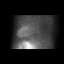

[Series 1: wbr_s-proj_st stress-gsp · 6.40mm/px · 6 of 512 frames shown]
[frame 43/512]
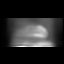
[frame 128/512]
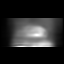
[frame 214/512]
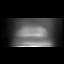
[frame 299/512]
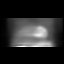
[frame 384/512]
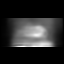
[frame 470/512]
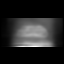

[Series 1: stress-gsp · 6.40mm/px · 6 of 506 frames shown]
[frame 43/506  full-range]
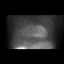
[frame 127/506  full-range]
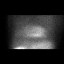
[frame 211/506  full-range]
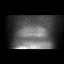
[frame 296/506  full-range]
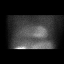
[frame 380/506  full-range]
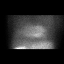
[frame 464/506  full-range]
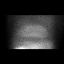

[36 of 36 positions shown; findings below may reference images not displayed]

Canned report from images found in remote index.

Refer to host system for actual result text.

## 2018-09-25 ENCOUNTER — Other Ambulatory Visit: Payer: Self-pay | Admitting: Cardiology

## 2018-10-05 ENCOUNTER — Other Ambulatory Visit: Payer: Self-pay

## 2018-10-06 ENCOUNTER — Telehealth: Payer: Self-pay | Admitting: Cardiology

## 2018-10-06 MED ORDER — METFORMIN HCL 500 MG PO TABS: 500.0000 mg | ORAL_TABLET | Freq: Two times a day (BID) | ORAL | 0 refills | Status: DC

## 2018-10-06 NOTE — Telephone Encounter (Signed)
Patient  Has appt in january   1. Which medications need to be refilled? (please list name of each medication and dose if known) metformin 500mg  twice a day  2. Which pharmacy/location (including street and city if local pharmacy) is medication to be sent to? [facility] [address]  3. Do they need a 30 day or 90 day supply? 30

## 2018-11-03 ENCOUNTER — Ambulatory Visit: Payer: Self-pay | Admitting: Cardiology

## 2018-11-03 ENCOUNTER — Telehealth: Payer: Self-pay | Admitting: Cardiology

## 2018-11-03 NOTE — Telephone Encounter (Signed)
Patient had to reschedule appt due to work   1. Which medications need to be refilled? (please list name of each medication and dose if known) metformin 500mg  tablet  2. Which pharmacy/location (including street and city if local pharmacy) is medication to be sent to? [facility] [address]  3. Do they need a 30 day or 90 day supply? 30

## 2018-11-03 NOTE — Telephone Encounter (Signed)
He needs appointement, we did not see him for more than 1 year.  Metformin should be written by PMD  Get appointement for him  Ok to give Rx until lthat appointement

## 2018-11-04 ENCOUNTER — Ambulatory Visit: Payer: Self-pay | Admitting: Cardiology

## 2018-11-04 NOTE — Telephone Encounter (Signed)
Informed patient that we can refill until next appointment with [place], then he would need a pcp to refill this further. Patient aware, states he may just need to see a primary care doctor instead because he made the appointment just to get his medication refilled. I offered him a primary care provider number, he declinded stats he will call us back because he has some other things to figure out. He is unsure if he will be able to make the appointment with Dr.  Bing Matter but he will let us know. I advised him no refills will be made if he isn't going to attend appointment. He understands and will follow back up with [place].

## 2018-11-10 ENCOUNTER — Telehealth: Payer: Self-pay | Admitting: Cardiology

## 2018-11-10 MED ORDER — METFORMIN HCL 500 MG PO TABS: 500.0000 mg | ORAL_TABLET | Freq: Two times a day (BID) | ORAL | 0 refills | Status: DC

## 2018-11-10 NOTE — Telephone Encounter (Signed)
Patient requesting extended refill until 11/20/2018 for his appointment with [facility]  for metformin 500 mg twice daily. Will confirm this with Dr. Tomie China

## 2018-11-10 NOTE — Telephone Encounter (Signed)
Per Dr. Tomie China Metformin 500 mg twice daily refilled until appointment on 11/20/2018. Patient informed if he doesn't come to that appointment no further refills will be given patient verbally understands.

## 2018-11-10 NOTE — Telephone Encounter (Signed)
Patient had to change his appt to 1/31 so is asking for more metformin sent to his pharmacy until this appt. I explained to him that this would be the last time this is done. Patient voiced understanding.

## 2018-11-10 NOTE — Telephone Encounter (Signed)
ok 

## 2018-11-20 ENCOUNTER — Encounter: Payer: Self-pay | Admitting: Cardiology

## 2018-11-20 ENCOUNTER — Ambulatory Visit (INDEPENDENT_AMBULATORY_CARE_PROVIDER_SITE_OTHER): Payer: BLUE CROSS/BLUE SHIELD | Admitting: Cardiology

## 2018-11-20 VITALS — BP 110/70 | HR 72 | Ht 73.0 in | Wt 202.8 lb

## 2018-11-20 DIAGNOSIS — E119 Type 2 diabetes mellitus without complications: Secondary | ICD-10-CM | POA: Diagnosis not present

## 2018-11-20 DIAGNOSIS — E785 Hyperlipidemia, unspecified: Secondary | ICD-10-CM

## 2018-11-20 DIAGNOSIS — R0789 Other chest pain: Secondary | ICD-10-CM

## 2018-11-20 MED ORDER — METFORMIN HCL 500 MG PO TABS: 500.0000 mg | ORAL_TABLET | Freq: Two times a day (BID) | ORAL | 0 refills | Status: DC

## 2018-11-20 NOTE — Patient Instructions (Signed)
Medication Instructions:   Your physician recommends that you continue on your current medications as directed. Please refer to the Current Medication list given to you today.  If you need a refill on your cardiac medications before your next appointment, please call your pharmacy.   Lab work:  Your physician recommends that you return for lab work today: CMP, CBC, TSH, LIPIDS.  If you have labs (blood work) drawn today and your tests are completely normal, you will receive your results only by: Marland Kitchen. MyChart Message (if you have MyChart) OR . A paper copy in the mail If you have any lab test that is abnormal or we need to change your treatment, we will call you to review the results.  Testing/Procedures:  NONE  Follow-Up: At [facility], you and your health needs are our priority.  As part of our continuing mission to provide you with exceptional heart care, we have created designated Provider Care Teams.  These Care Teams include your primary Cardiologist (physician) and Advanced Practice Providers (APPs -  Physician Assistants and Nurse Practitioners) who all work together to provide you with the care you need, when you need it.  . You will need a follow up appointment in 3 months.

## 2018-11-20 NOTE — Progress Notes (Signed)
Cardiology Office Note:    Date:  11/20/2018   ID:  Isaiah Spencer, DOB [birth year], MRN [id number]  PCP:  Isaiah Spencer, Isaiah Spencer, Isaiah Spencer  Cardiologist:  Isaiah Spencer, Isaiah Spencer    Referring Isaiah Spencer: No ref. provider found   Chief Complaint  Patient presents with  . Follow-up  Doing well  History of Present Illness:    Isaiah Spencer is a 53 y.o. male with multiple medical problems citing the biggest issue is noncompliance he does have diabetes which is very poorly controlled and we have just discussed repeatedly finally he agreed to get follow-up primary care physician and he does have first appointment on 14 February denies have any chest pain complain of having fatigue tiredness and shortness of breath while walking.  Past Medical History:  Diagnosis Date  . Allergy   . Asthma    AS A CHILD  . Diabetes mellitus without complication (HCC)   . GERD (gastroesophageal reflux disease)   . H. pylori infection   . Hemorrhoids   . Hyperlipemia   . OSA (obstructive sleep apnea)    does not use c pap    Past Surgical History:  Procedure Laterality Date  . EXAMINATION UNDER ANESTHESIA  11/21/2011   Procedure: EXAM UNDER ANESTHESIA;  Surgeon: Isaiah Spencer, Isaiah Spencer;  [facility];  Service: General;  Laterality: N/A;  . HEMORRHOID SURGERY  11/21/2011   Procedure: HEMORRHOIDECTOMY;  Surgeon: Isaiah Spencer, Isaiah Spencer;  [facility];  Service: General;  Laterality: N/A;  Excisional hemorrhoidectomy times two  . SUPERFICIAL LYMPH NODE BIOPSY / EXCISION  90's   non cancerous    Current Medications: Current Meds  Medication Sig  . aspirin 325 MG tablet Take 325 mg by mouth daily.  Marland Kitchen. esomeprazole (NEXIUM) 20 MG capsule Take 20 mg by mouth daily at 12 noon.  . metFORMIN (GLUCOPHAGE) 500 MG tablet Take 1 tablet (500 mg total) by mouth 2 (two) times daily with a meal.     Allergies:   Amoxicillin-pot clavulanate and Penicillins   Social History   Socioeconomic History  . Marital status: Single   Spouse name: Not on file  . Number of children: 2  . Years of education: Not on file  . Highest education level: Not on file  Occupational History  . Occupation: DJ    Associate Professormployer: [employer]  Social Needs  . Financial resource strain: Not on file  . Food insecurity:    Worry: Not on file    Inability: Not on file  . Transportation needs:    Medical: Not on file    Non-medical: Not on file  Tobacco Use  . Smoking status: Never Smoker  . Smokeless tobacco: Never Used  Substance and Sexual Activity  . Alcohol use: Yes    Alcohol/week: 2.0 standard drinks    Types: 2 drink(s) per week    Comment: socially  . Drug use: No  . Sexual activity: Not on file  Lifestyle  . Physical activity:    Days per week: Not on file    Minutes per session: Not on file  . Stress: Not on file  Relationships  . Social connections:    Talks on phone: Not on file    Gets together: Not on file    Attends religious service: Not on file    Active member of club or organization: Not on file    Attends meetings of clubs or organizations: Not on file    Relationship status: Not on  file  Other Topics Concern  . Not on file  Social History Narrative   Exercising:  none     Family History: The patient's family history includes Diabetes in his mother; Heart disease (age of onset: 22) in his mother; Hypertension in his mother. There is no history of Colon cancer, Rectal cancer, Stomach cancer, or Esophageal cancer. ROS:   Please see the history of present illness.    All 14 point review of systems negative except as described per history of present illness  EKGs/Labs/Other Studies Reviewed:      Recent Labs: No results found for requested labs within last 8760 hours.  Recent Lipid Panel    Component Value Date/Time   CHOL 154 03/13/2015 1422   TRIG 111 03/13/2015 1422   HDL 34 (L) 03/13/2015 1422   CHOLHDL 4.5 03/13/2015 1422   VLDL 22 03/13/2015 1422   LDLCALC 98 03/13/2015 1422    Physical  Exam:    VS:  BP 110/70   Pulse 72   Ht 6\' 1"  (1.854 m)   Wt 202 lb 12.8 oz (92 kg)   SpO2 97%   BMI 26.76 kg/m     Wt Readings from Last 3 Encounters:  11/20/18 202 lb 12.8 oz (92 kg)  04/29/18 200 lb 6 oz (90.9 kg)  01/16/18 203 lb 4 oz (92.2 kg)     GEN:  Well nourished, well developed in no acute distress HEENT: Normal NECK: No JVD; No carotid bruits LYMPHATICS: No lymphadenopathy CARDIAC: RRR, no murmurs, no rubs, no gallops RESPIRATORY:  Clear to auscultation without rales, wheezing or rhonchi  ABDOMEN: Soft, non-tender, non-distended MUSCULOSKELETAL:  No edema; No deformity  SKIN: Warm and dry LOWER EXTREMITIES: no swelling NEUROLOGIC:  Alert and oriented x 3 PSYCHIATRIC:  Normal affect   ASSESSMENT:    1. Chest discomfort   2. Type 2 diabetes mellitus without complication, without long-term current use of insulin (HCC)   3. Dyslipidemia    PLAN:    In order of problems listed above:  1. Chest discomfort denies having any.  So far work-up has been negative.  The key is modification of risk factors for coronary artery disease that he does not do well. 2. For diabetes I will check routine blood test he is already scheduled to he see his primary care physician on 14 February. 3. Dyslipidemia I try to explain to him that he need to be on cholesterol medication we will check his fasting lipid profile and then decide   Medication Adjustments/Labs and Tests Ordered: Current medicines are reviewed at length with the patient today.  Concerns regarding medicines are outlined above.  Orders Placed This Encounter  Procedures  . Comprehensive Metabolic Panel (CMET)  . CBC  . TSH  . Lipid panel   Medication changes: No orders of the defined types were placed in this encounter.   Signed, Isaiah Spencer, Isaiah Spencer, Isaiah Spencer 11/20/2018 10:59 AM    [facility]

## 2018-11-21 LAB — LIPID PANEL
CHOLESTEROL TOTAL: 145 mg/dL (ref 100–199)
Chol/HDL Ratio: 4.3 ratio (ref 0.0–5.0)
HDL: 34 mg/dL — ABNORMAL LOW (ref >39)
LDL Calculated: 93 mg/dL (ref 0–99)
Triglycerides: 89 mg/dL (ref 0–149)
VLDL Cholesterol Cal: 18 mg/dL (ref 5–40)

## 2018-11-21 LAB — COMPREHENSIVE METABOLIC PANEL
ALT: 16 IU/L (ref 0–44)
AST: 15 IU/L (ref 0–40)
Albumin/Globulin Ratio: 2 (ref 1.2–2.2)
Albumin: 4.5 g/dL (ref 3.8–4.9)
Alkaline Phosphatase: 85 IU/L (ref 39–117)
BILIRUBIN TOTAL: 1.5 mg/dL — AB (ref 0.0–1.2)
BUN/Creatinine Ratio: 16 (ref 9–20)
BUN: 15 mg/dL (ref 6–24)
CHLORIDE: 99 mmol/L (ref 96–106)
CO2: 25 mmol/L (ref 20–29)
Calcium: 9.8 mg/dL (ref 8.7–10.2)
Creatinine, Ser: 0.96 mg/dL (ref 0.76–1.27)
GFR calc Af Amer: 105 mL/min/{1.73_m2} (ref >59)
GFR calc non Af Amer: 91 mL/min/{1.73_m2} (ref >59)
GLUCOSE: 211 mg/dL — AB (ref 65–99)
Globulin, Total: 2.2 g/dL (ref 1.5–4.5)
Potassium: 4.6 mmol/L (ref 3.5–5.2)
Sodium: 138 mmol/L (ref 134–144)
Total Protein: 6.7 g/dL (ref 6.0–8.5)

## 2018-11-21 LAB — CBC
Hematocrit: 47.8 % (ref 37.5–51.0)
Hemoglobin: 16.8 g/dL (ref 13.0–17.7)
MCH: 30.9 pg (ref 26.6–33.0)
MCHC: 35.1 g/dL (ref 31.5–35.7)
MCV: 88 fL (ref 79–97)
Platelets: 248 10*3/uL (ref 150–450)
RBC: 5.44 x10E6/uL (ref 4.14–5.80)
RDW: 13 % (ref 11.6–15.4)
WBC: 4.3 10*3/uL (ref 3.4–10.8)

## 2018-11-21 LAB — TSH: TSH: 1.35 u[IU]/mL (ref 0.450–4.500)

## 2019-03-01 ENCOUNTER — Emergency Department (HOSPITAL_COMMUNITY)
Admission: EM | Admit: 2019-03-01 | Discharge: 2019-03-01 | Disposition: A | Discharge status: Home / Self Care | Payer: BLUE CROSS/BLUE SHIELD | Attending: Emergency Medicine | Admitting: Emergency Medicine

## 2019-03-01 ENCOUNTER — Other Ambulatory Visit: Payer: Self-pay

## 2019-03-01 ENCOUNTER — Emergency Department (HOSPITAL_COMMUNITY): Payer: BLUE CROSS/BLUE SHIELD

## 2019-03-01 ENCOUNTER — Encounter (HOSPITAL_COMMUNITY): Payer: Self-pay | Admitting: Emergency Medicine

## 2019-03-01 DIAGNOSIS — M545 Low back pain, unspecified: Secondary | ICD-10-CM

## 2019-03-01 DIAGNOSIS — M25552 Pain in left hip: Secondary | ICD-10-CM | POA: Diagnosis not present

## 2019-03-01 DIAGNOSIS — M62830 Muscle spasm of back: Secondary | ICD-10-CM | POA: Insufficient documentation

## 2019-03-01 DIAGNOSIS — Z7982 Long term (current) use of aspirin: Secondary | ICD-10-CM | POA: Insufficient documentation

## 2019-03-01 DIAGNOSIS — Z7984 Long term (current) use of oral hypoglycemic drugs: Secondary | ICD-10-CM | POA: Insufficient documentation

## 2019-03-01 DIAGNOSIS — Z79899 Other long term (current) drug therapy: Secondary | ICD-10-CM | POA: Diagnosis not present

## 2019-03-01 DIAGNOSIS — E119 Type 2 diabetes mellitus without complications: Secondary | ICD-10-CM | POA: Insufficient documentation

## 2019-03-01 DIAGNOSIS — J45909 Unspecified asthma, uncomplicated: Secondary | ICD-10-CM | POA: Diagnosis not present

## 2019-03-01 MED ORDER — LIDOCAINE 5 % EX PTCH: 1.0000 | MEDICATED_PATCH | CUTANEOUS | 0 refills | Status: DC

## 2019-03-01 MED ORDER — CYCLOBENZAPRINE HCL 10 MG PO TABS: 10.0000 mg | ORAL_TABLET | Freq: Two times a day (BID) | ORAL | 0 refills | Status: DC | PRN

## 2019-03-01 MED ORDER — LIDOCAINE 5 % EX PTCH
1.0000 | MEDICATED_PATCH | CUTANEOUS | Status: DC
  Administered 2019-03-01: 1 via TRANSDERMAL
  Filled 2019-03-01: qty 1

## 2019-03-01 MED ORDER — CYCLOBENZAPRINE HCL 10 MG PO TABS
10.0000 mg | ORAL_TABLET | Freq: Once | ORAL | Status: AC
  Administered 2019-03-01: 10 mg via ORAL
  Filled 2019-03-01: qty 1

## 2019-03-01 NOTE — Discharge Instructions (Addendum)
Your imaging today showed no fracture or dislocation in your low back or your hip.  We did see some mild degenerative disease in your low back with arthritis.  Please go to your appointment with your back doctor this Wednesday and use the muscle relaxant and the numbing patch to help with your discomfort.  Please do not drive or operate heavy machinery with the medication.  Please stay hydrated and follow-up with your PCP.

## 2019-03-01 NOTE — ED Notes (Signed)
Patient transported to X-ray 

## 2019-03-01 NOTE — ED Triage Notes (Signed)
Pt reports that he had back pains for month or more and recently having back spasms. Reports pains are worse with turning.  Thinks he was seen here last summer some time for back pains.

## 2019-03-01 NOTE — ED Notes (Signed)
Bed: WTR5 Expected date:  Expected time:  Means of arrival:  Comments: 
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
## 2020-07-19 ENCOUNTER — Encounter (HOSPITAL_COMMUNITY): Payer: Self-pay | Admitting: Emergency Medicine

## 2020-07-19 ENCOUNTER — Emergency Department (HOSPITAL_COMMUNITY): Payer: HRSA Program

## 2020-07-19 ENCOUNTER — Other Ambulatory Visit: Payer: Self-pay | Admitting: Infectious Diseases

## 2020-07-19 ENCOUNTER — Emergency Department (HOSPITAL_COMMUNITY)
Admission: EM | Admit: 2020-07-19 | Discharge: 2020-07-19 | Disposition: A | Discharge status: Home / Self Care | Payer: HRSA Program | Attending: Emergency Medicine | Admitting: Emergency Medicine

## 2020-07-19 ENCOUNTER — Other Ambulatory Visit: Payer: Self-pay

## 2020-07-19 ENCOUNTER — Telehealth: Payer: Self-pay | Admitting: Infectious Diseases

## 2020-07-19 ENCOUNTER — Emergency Department (HOSPITAL_COMMUNITY)
Admit: 2020-07-19 | Discharge: 2020-07-19 | Disposition: A | Discharge status: Home / Self Care | Payer: HRSA Program | Attending: Pulmonary Disease | Admitting: Pulmonary Disease

## 2020-07-19 DIAGNOSIS — E785 Hyperlipidemia, unspecified: Secondary | ICD-10-CM

## 2020-07-19 DIAGNOSIS — U071 COVID-19: Secondary | ICD-10-CM

## 2020-07-19 DIAGNOSIS — E119 Type 2 diabetes mellitus without complications: Secondary | ICD-10-CM | POA: Insufficient documentation

## 2020-07-19 DIAGNOSIS — J45909 Unspecified asthma, uncomplicated: Secondary | ICD-10-CM | POA: Diagnosis not present

## 2020-07-19 DIAGNOSIS — Z6829 Body mass index (BMI) 29.0-29.9, adult: Secondary | ICD-10-CM

## 2020-07-19 DIAGNOSIS — Z7984 Long term (current) use of oral hypoglycemic drugs: Secondary | ICD-10-CM | POA: Diagnosis not present

## 2020-07-19 DIAGNOSIS — G4733 Obstructive sleep apnea (adult) (pediatric): Secondary | ICD-10-CM

## 2020-07-19 DIAGNOSIS — Z7982 Long term (current) use of aspirin: Secondary | ICD-10-CM | POA: Insufficient documentation

## 2020-07-19 DIAGNOSIS — R0602 Shortness of breath: Secondary | ICD-10-CM | POA: Diagnosis present

## 2020-07-19 HISTORY — DX: COVID-19: U07.1

## 2020-07-19 LAB — CBC WITH DIFFERENTIAL/PLATELET
Abs Immature Granulocytes: 0.01 10*3/uL (ref 0.00–0.07)
Basophils Absolute: 0 10*3/uL (ref 0.0–0.1)
Basophils Relative: 0 %
Eosinophils Absolute: 0 10*3/uL (ref 0.0–0.5)
Eosinophils Relative: 0 %
HCT: 45.7 % (ref 39.0–52.0)
Hemoglobin: 15.9 g/dL (ref 13.0–17.0)
Immature Granulocytes: 0 %
Lymphocytes Relative: 22 %
Lymphs Abs: 0.9 10*3/uL (ref 0.7–4.0)
MCH: 31.3 pg (ref 26.0–34.0)
MCHC: 34.8 g/dL (ref 30.0–36.0)
MCV: 90 fL (ref 80.0–100.0)
Monocytes Absolute: 0.2 10*3/uL (ref 0.1–1.0)
Monocytes Relative: 4 %
Neutro Abs: 3.1 10*3/uL (ref 1.7–7.7)
Neutrophils Relative %: 74 %
Platelets: 196 10*3/uL (ref 150–400)
RBC: 5.08 MIL/uL (ref 4.22–5.81)
RDW: 12.1 % (ref 11.5–15.5)
WBC: 4.2 10*3/uL (ref 4.0–10.5)
nRBC: 0 % (ref 0.0–0.2)

## 2020-07-19 LAB — BASIC METABOLIC PANEL
Anion gap: 10 (ref 5–15)
BUN: 12 mg/dL (ref 6–20)
CO2: 23 mmol/L (ref 22–32)
Calcium: 8.6 mg/dL — ABNORMAL LOW (ref 8.9–10.3)
Chloride: 98 mmol/L (ref 98–111)
Creatinine, Ser: 1.03 mg/dL (ref 0.61–1.24)
GFR calc Af Amer: >60 mL/min (ref >60)
GFR calc non Af Amer: >60 mL/min (ref >60)
Glucose, Bld: 118 mg/dL — ABNORMAL HIGH (ref 70–99)
Potassium: 4 mmol/L (ref 3.5–5.1)
Sodium: 131 mmol/L — ABNORMAL LOW (ref 135–145)

## 2020-07-19 MED ORDER — ACETAMINOPHEN 325 MG PO TABS
650.0000 mg | ORAL_TABLET | Freq: Once | ORAL | Status: AC
  Administered 2020-07-19: 650 mg via ORAL
  Filled 2020-07-19: qty 2

## 2020-07-19 MED ORDER — SODIUM CHLORIDE 0.9 % IV SOLN
1200.0000 mg | Freq: Once | INTRAVENOUS | Status: AC
  Administered 2020-07-19: 1200 mg via INTRAVENOUS

## 2020-07-19 MED ORDER — DIPHENHYDRAMINE HCL 50 MG/ML IJ SOLN: 50.0000 mg | Freq: Once | INTRAMUSCULAR | Status: DC | PRN

## 2020-07-19 MED ORDER — SODIUM CHLORIDE 0.9 % IV SOLN: INTRAVENOUS | Status: DC | PRN

## 2020-07-19 MED ORDER — FAMOTIDINE IN NACL 20-0.9 MG/50ML-% IV SOLN: 20.0000 mg | Freq: Once | INTRAVENOUS | Status: DC | PRN

## 2020-07-19 MED ORDER — ALBUTEROL SULFATE HFA 108 (90 BASE) MCG/ACT IN AERS: 2.0000 | INHALATION_SPRAY | Freq: Once | RESPIRATORY_TRACT | Status: DC | PRN

## 2020-07-19 MED ORDER — ALBUTEROL SULFATE HFA 108 (90 BASE) MCG/ACT IN AERS
2.0000 | INHALATION_SPRAY | Freq: Once | RESPIRATORY_TRACT | Status: AC
  Administered 2020-07-19: 2 via RESPIRATORY_TRACT
  Filled 2020-07-19: qty 6.7

## 2020-07-19 MED ORDER — BENZONATATE 100 MG PO CAPS: 100.0000 mg | ORAL_CAPSULE | Freq: Three times a day (TID) | ORAL | 0 refills | Status: AC

## 2020-07-19 MED ORDER — METHYLPREDNISOLONE SODIUM SUCC 125 MG IJ SOLR: 125.0000 mg | Freq: Once | INTRAMUSCULAR | Status: DC | PRN

## 2020-07-19 MED ORDER — EPINEPHRINE 0.3 MG/0.3ML IJ SOAJ: 0.3000 mg | Freq: Once | INTRAMUSCULAR | Status: DC | PRN

## 2020-07-19 MED ORDER — AEROCHAMBER PLUS FLO-VU LARGE MISC: 1.0000 | Freq: Once | Status: DC

## 2020-07-19 NOTE — Telephone Encounter (Signed)
Called to Discuss with patient about Covid symptoms and the use of the monoclonal antibody infusion for those with mild to moderate Covid symptoms and at a high risk of hospitalization.     Pt appears to qualify for this infusion due to co-morbid conditions and/or a member of an at-risk group in accordance with the FDA Emergency Use Authorization.    Unable to reach pt -   He is currently in [facility] emergency room being seen.  Trying to arrange for afternoon infusion appointment here at [facility].  I left a voicemail with my work cell to try to arrange.  With review of his chart it appears that our other sites have been trying to reach him to arrange for Mab infusion.  May be best to treat him in the ER since he is on day 10 today and last date received.   Rexene Alberts, MSN, NP-C [facility] for Infectious Disease [facility]  [email or web address] Pager: [phone] Office: [phone] [facility] Main Line: [phone]

## 2020-07-19 NOTE — ED Provider Notes (Signed)
[facility] EMERGENCY DEPARTMENT Provider Note   CSN: [id number] Arrival date & time: 07/19/20  5102     History Chief Complaint  Patient presents with  . COVID+/SOB/Cough    Isaiah Spencer is a 54 y.o. male.  HPI   Pt is a 54 y/o male with a h/o DM, GERD, who presents to the ED today for eval of COVID sxs. States sxs started 10 days ago with sore throat, fever, headache, cough that is nonproductive. States he became SOB for the last few days which prompted his visit to the ED. States that when he has coughing spells he has SOB. He does not have SOB when at rest and states that he actually feels his breathing is better when he is up walking around. Denies chest pain.   He has been taking delsym without relief. He has also tried nyquil without relief.   Pt has not been vaccinated.   Past Medical History:  Diagnosis Date  . Allergy   . Asthma    AS A CHILD  . COVID-19   . Diabetes mellitus without complication (HCC)   . GERD (gastroesophageal reflux disease)   . H. pylori infection   . Hemorrhoids   . Hyperlipemia   . OSA (obstructive sleep apnea)    does not use c pap    Patient Active Problem List   Diagnosis Date Noted  . Dyslipidemia 11/20/2018  . Chest discomfort 01/21/2014  . OSA (obstructive sleep apnea) 11/02/2012  . Diabetes mellitus, type 2 (HCC) 07/30/2012  . Dysphagia, unspecified(787.20) 12/16/2011  . GERD (gastroesophageal reflux disease) 12/07/2011  . H. pylori infection 12/07/2011  . Internal hemorrhoids 07/10/2011    Past Surgical History:  Procedure Laterality Date  . EXAMINATION UNDER ANESTHESIA  11/21/2011   Procedure: EXAM UNDER ANESTHESIA;  Surgeon: Atilano Ina, MD;  Location: [facility] OR;  Service: General;  Laterality: N/A;  . HEMORRHOID SURGERY  11/21/2011   Procedure: HEMORRHOIDECTOMY;  Surgeon: Atilano Ina, MD;  Location: [facility] OR;  Service: General;  Laterality: N/A;  Excisional hemorrhoidectomy times two  . SUPERFICIAL  LYMPH NODE BIOPSY / EXCISION  90's   non cancerous       Family History  Problem Relation Age of Onset  . Heart disease Mother 25       heart attack   . Diabetes Mother   . Hypertension Mother   . Colon cancer Neg Hx   . Rectal cancer Neg Hx   . Stomach cancer Neg Hx   . Esophageal cancer Neg Hx     Social History   Tobacco Use  . Smoking status: Never Smoker  . Smokeless tobacco: Never Used  Vaping Use  . Vaping Use: Never used  Substance Use Topics  . Alcohol use: Yes    Alcohol/week: 2.0 standard drinks    Types: 2 drink(s) per week    Comment: socially  . Drug use: No    Home Medications Prior to Admission medications   Medication Sig Start Date End Date Taking? Authorizing Provider  aspirin 325 MG tablet Take 325 mg by mouth daily.    [provider]  benzonatate (TESSALON) 100 MG capsule Take 1 capsule (100 mg total) by mouth every 8 (eight) hours for 5 days. 07/19/20 07/24/20  Golden Gilreath S, PA-C  cyclobenzaprine (FLEXERIL) 10 MG tablet Take 1 tablet (10 mg total) by mouth 2 (two) times daily as needed for muscle spasms. 03/01/19   Tegeler, Canary Brim, MD  esomeprazole (NEXIUM) 20 MG capsule Take 20 mg by mouth daily at 12 noon.    [provider]  lidocaine (LIDODERM) 5 % Place 1 patch onto the skin daily. Remove & Discard patch within 12 hours or as directed by MD 03/01/19   Tegeler, Canary Brim, MD  lisinopril (ZESTRIL) 2.5 MG tablet Take 2.5 mg by mouth daily. 05/10/20   [provider]  metFORMIN (GLUCOPHAGE) 1000 MG tablet Take 1,000 mg by mouth 2 (two) times daily. 06/23/20   [provider]    Allergies    Amoxicillin-pot clavulanate and Penicillins  Review of Systems   Review of Systems  Constitutional: Positive for fatigue and fever. Negative for chills.  HENT: Positive for sore throat. Negative for ear pain.   Eyes: Negative for pain and visual disturbance.  Respiratory: Positive for cough and shortness of  breath.   Cardiovascular: Negative for chest pain.  Gastrointestinal: Positive for diarrhea. Negative for abdominal pain, constipation, nausea and vomiting.  Genitourinary: Negative for dysuria and hematuria.  Musculoskeletal: Negative for arthralgias and back pain.  Skin: Negative for color change and rash.  Neurological: Positive for headaches. Negative for seizures and syncope.  All other systems reviewed and are negative.   Physical Exam Updated Vital Signs BP 116/70   Pulse 88   Temp 99.1 F (37.3 C)   Resp 20   Ht 6\' 1"  (1.854 m)   Wt 102 kg   SpO2 93%   BMI 29.67 kg/m   Physical Exam Vitals and nursing note reviewed.  Constitutional:      Appearance: He is well-developed.  HENT:     Head: Normocephalic and atraumatic.  Eyes:     Conjunctiva/sclera: Conjunctivae normal.  Cardiovascular:     Rate and Rhythm: Normal rate and regular rhythm.     Heart sounds: Normal heart sounds. No murmur heard.   Pulmonary:     Effort: Pulmonary effort is normal. No tachypnea or respiratory distress.     Breath sounds: Examination of the right-lower field reveals rales. Examination of the left-lower field reveals rales. Rales present.  Abdominal:     General: Bowel sounds are normal.     Palpations: Abdomen is soft.     Tenderness: There is no abdominal tenderness.  Musculoskeletal:     Cervical back: Neck supple.  Skin:    General: Skin is warm and dry.  Neurological:     Mental Status: He is alert.     ED Results / Procedures / Treatments   Labs (all labs ordered are listed, but only abnormal results are displayed) Labs Reviewed  BASIC METABOLIC PANEL - Abnormal; Notable for the following components:      Result Value   Sodium 131 (*)    Glucose, Bld 118 (*)    Calcium 8.6 (*)    All other components within normal limits  CBC WITH DIFFERENTIAL/PLATELET    EKG None  Radiology DG Chest Portable 1 View  Result Date: 07/19/2020 CLINICAL DATA:  COVID positive,  shortness of breath, cough. EXAM: PORTABLE CHEST 1 VIEW COMPARISON:  Prior chest radiograph 05/13/2017 and earlier FINDINGS: Heart size within normal limits. There are faint multifocal interstitial and ill-defined airspace opacities bilaterally (greater on the right). There is no evidence of pleural effusion or pneumothorax. No acute bony abnormality identified. IMPRESSION: Faint multifocal interstitial and ill-defined airspace opacities bilaterally. Findings likely reflect atypical/viral pneumonia given the provided history. Electronically Signed   By: 05/15/2017 DO   On: 07/19/2020 08:07  Procedures Procedures (including critical care time)     Medications Ordered in ED Medications  AeroChamber Plus Flo-Vu Large MISC 1 each (1 each Other Not Given 07/19/20 1120)  albuterol (VENTOLIN HFA) 108 (90 Base) MCG/ACT inhaler 2 puff (2 puffs Inhalation Given 07/19/20 1100)    ED Course  I have reviewed the triage vital signs and the nursing notes.  Pertinent labs & imaging results that were available during my care of the patient were reviewed by me and considered in my medical decision making (see chart for details).    MDM Rules/Calculators/A&P                          54 year old male presenting to the emergency department today for evaluation of Covid symptoms.  Is having intermittent shortness of breath and persistent cough.  He is on day 10 of symptoms.  His vital signs are reassuring here.  He has no evidence of hypoxia and is in no acute distress.  His chest x-ray shows bilateral infiltrates consistent with Covid.  His lab tests are reassuring without any evidence of AKI or significant dehydration.  Have discussed with the M[facility] clinic who can get him in for an infusion today.  Have also given him Rx for symptomatic treatment.  He has a PCP and recommended close follow-up as an outpatient with them.  He does not appear to meet admission criteria today and he feels comfortable with the plan  for discharge.  He understands reasons to return.  All questions answered.  Patient stable for discharge.  Isaiah Spencer was evaluated in Emergency Department on 07/19/2020 for the symptoms described in the history of present illness. He was evaluated in the context of the global COVID-19 pandemic, which necessitated consideration that the patient might be at risk for infection with the SARS-CoV-2 virus that causes COVID-19. Institutional protocols and algorithms that pertain to the evaluation of patients at risk for COVID-19 are in a state of rapid change based on information released by regulatory bodies including the CDC and federal and state organizations. These policies and algorithms were followed during the patient's care in the ED.  Final Clinical Impression(s) / ED Diagnoses Final diagnoses:  COVID-19    Rx / DC Orders ED Discharge Orders         Ordered    benzonatate (TESSALON) 100 MG capsule  Every 8 hours        07/19/20 1044           Kenlynn Houde S, PA-C 07/19/20 1129    Maia Plan, MD 07/20/20 1012

## 2020-07-19 NOTE — Telephone Encounter (Signed)
I connected with Isaiah Spencer. Explained treatment and will schedule him today at 1:30 pm.   I sent him a text message with directions to our clinic.

## 2020-07-19 NOTE — Progress Notes (Signed)
  Diagnosis: COVID-19  Physician:Dr Wright  Procedure: Covid Infusion Clinic Med: casirivimab\imdevimab infusion - Provided patient with casirivimab\imdevimab fact sheet for patients, parents and caregivers prior to infusion.  Complications: No immediate complications noted.  Discharge: Discharged home   Arlanda Shiplett R [place] 07/19/2020  

## 2020-07-19 NOTE — Progress Notes (Signed)
I connected by phone with Isaiah Spencer on 07/19/2020 at 11:10 AM to discuss the potential use of a new treatment for mild to moderate COVID-19 viral infection in non-hospitalized patients.  This patient is a 54 y.o. male that meets the FDA criteria for Emergency Use Authorization of COVID monoclonal antibody casirivimab/imdevimab or bamlanivimab/eteseviamb.  Has a (+) direct SARS-CoV-2 viral test result  Has mild or moderate COVID-19   Is NOT hospitalized due to COVID-19  Is within 10 days of symptom onset  Has at least one of the high risk factor(s) for progression to severe COVID-19 and/or hospitalization as defined in EUA.  Specific high risk criteria : BMI > 25, Diabetes and Other high risk medical condition per CDC:  SVI criteria met   I have spoken and communicated the following to the patient or parent/caregiver regarding COVID monoclonal antibody treatment:  1. FDA has authorized the emergency use for the treatment of mild to moderate COVID-19 in adults and pediatric patients with positive results of direct SARS-CoV-2 viral testing who are 44 years of age and older weighing at least 40 kg, and who are at high risk for progressing to severe COVID-19 and/or hospitalization.  2. The significant known and potential risks and benefits of COVID monoclonal antibody, and the extent to which such potential risks and benefits are unknown.  3. Information on available alternative treatments and the risks and benefits of those alternatives, including clinical trials.  4. Patients treated with COVID monoclonal antibody should continue to self-isolate and use infection control measures (e.g., wear mask, isolate, social distance, avoid sharing personal items, clean and disinfect "high touch" surfaces, and frequent handwashing) according to CDC guidelines.   5. The patient or parent/caregiver has the option to accept or refuse COVID monoclonal antibody treatment.  After reviewing this  information with the patient, the patient has agreed to receive one of the available covid 19 monoclonal antibodies and will be provided an appropriate fact sheet prior to infusion. Janene Madeira, NP 07/19/2020 11:10 AM

## 2020-07-19 NOTE — Discharge Instructions (Signed)

## 2020-07-19 NOTE — Discharge Instructions (Signed)
Continue rotating Tylenol and Motrin for your fevers.  You can take Tessalon for your cough.  You can use 2 puffs of the albuterol inhaler every 4-6 hours as needed for shortness of breath and cough.  Make sure to stay well-hydrated.  Get lots of rest.  The infusion clinic will be contacting you to set up an appointment for the monoclonal antibody infusion.  Please follow-up with them as  Additionally, please make an appoint with your regular doctor in the next 5 to 7 days for reassessment and return to the emergency department for any new or worsening symptoms including increased shortness of breath, chest pain, oxygen below 92%, or any other concerns.

## 2020-07-19 NOTE — ED Triage Notes (Signed)
Patient tested positive for COVID19 last week reports worsening SOB with productive cough/chest congestion , unvaccinated , denies lost of taste or smell .

## 2020-07-27 ENCOUNTER — Other Ambulatory Visit: Payer: Self-pay

## 2020-07-27 ENCOUNTER — Encounter (HOSPITAL_COMMUNITY): Payer: Self-pay

## 2020-07-27 ENCOUNTER — Emergency Department (HOSPITAL_COMMUNITY): Payer: Self-pay

## 2020-07-27 ENCOUNTER — Inpatient Hospital Stay (HOSPITAL_COMMUNITY)
Admission: EM | Admit: 2020-07-27 | Discharge: 2020-07-29 | DRG: 101 | Disposition: A | Discharge status: Home / Self Care | Payer: Self-pay | Attending: Internal Medicine | Admitting: Internal Medicine

## 2020-07-27 DIAGNOSIS — I10 Essential (primary) hypertension: Secondary | ICD-10-CM | POA: Diagnosis present

## 2020-07-27 DIAGNOSIS — Z7982 Long term (current) use of aspirin: Secondary | ICD-10-CM

## 2020-07-27 DIAGNOSIS — Z880 Allergy status to penicillin: Secondary | ICD-10-CM

## 2020-07-27 DIAGNOSIS — Z8249 Family history of ischemic heart disease and other diseases of the circulatory system: Secondary | ICD-10-CM

## 2020-07-27 DIAGNOSIS — Z833 Family history of diabetes mellitus: Secondary | ICD-10-CM

## 2020-07-27 DIAGNOSIS — R17 Unspecified jaundice: Secondary | ICD-10-CM | POA: Diagnosis present

## 2020-07-27 DIAGNOSIS — R569 Unspecified convulsions: Principal | ICD-10-CM | POA: Diagnosis present

## 2020-07-27 DIAGNOSIS — K219 Gastro-esophageal reflux disease without esophagitis: Secondary | ICD-10-CM | POA: Diagnosis present

## 2020-07-27 DIAGNOSIS — E785 Hyperlipidemia, unspecified: Secondary | ICD-10-CM | POA: Diagnosis present

## 2020-07-27 DIAGNOSIS — U099 Post covid-19 condition, unspecified: Secondary | ICD-10-CM | POA: Diagnosis present

## 2020-07-27 DIAGNOSIS — Z20822 Contact with and (suspected) exposure to covid-19: Secondary | ICD-10-CM | POA: Diagnosis present

## 2020-07-27 DIAGNOSIS — Z79899 Other long term (current) drug therapy: Secondary | ICD-10-CM

## 2020-07-27 DIAGNOSIS — Z7984 Long term (current) use of oral hypoglycemic drugs: Secondary | ICD-10-CM

## 2020-07-27 DIAGNOSIS — Z8616 Personal history of COVID-19: Secondary | ICD-10-CM

## 2020-07-27 DIAGNOSIS — F419 Anxiety disorder, unspecified: Secondary | ICD-10-CM | POA: Diagnosis present

## 2020-07-27 DIAGNOSIS — D75839 Thrombocytosis, unspecified: Secondary | ICD-10-CM | POA: Diagnosis present

## 2020-07-27 DIAGNOSIS — E119 Type 2 diabetes mellitus without complications: Secondary | ICD-10-CM | POA: Diagnosis present

## 2020-07-27 DIAGNOSIS — G4733 Obstructive sleep apnea (adult) (pediatric): Secondary | ICD-10-CM | POA: Diagnosis present

## 2020-07-27 LAB — CBC WITH DIFFERENTIAL/PLATELET
Abs Immature Granulocytes: 0.03 10*3/uL (ref 0.00–0.07)
Basophils Absolute: 0 10*3/uL (ref 0.0–0.1)
Basophils Relative: 0 %
Eosinophils Absolute: 0 10*3/uL (ref 0.0–0.5)
Eosinophils Relative: 0 %
HCT: 40.9 % (ref 39.0–52.0)
Hemoglobin: 13.8 g/dL (ref 13.0–17.0)
Immature Granulocytes: 0 %
Lymphocytes Relative: 10 %
Lymphs Abs: 0.8 10*3/uL (ref 0.7–4.0)
MCH: 30.3 pg (ref 26.0–34.0)
MCHC: 33.7 g/dL (ref 30.0–36.0)
MCV: 89.9 fL (ref 80.0–100.0)
Monocytes Absolute: 0.5 10*3/uL (ref 0.1–1.0)
Monocytes Relative: 7 %
Neutro Abs: 5.9 10*3/uL (ref 1.7–7.7)
Neutrophils Relative %: 83 %
Platelets: 548 10*3/uL — ABNORMAL HIGH (ref 150–400)
RBC: 4.55 MIL/uL (ref 4.22–5.81)
RDW: 12.5 % (ref 11.5–15.5)
WBC: 7.3 10*3/uL (ref 4.0–10.5)
nRBC: 0 % (ref 0.0–0.2)

## 2020-07-27 LAB — URINALYSIS, ROUTINE W REFLEX MICROSCOPIC
Bacteria, UA: NONE SEEN
Bilirubin Urine: NEGATIVE
Glucose, UA: 50 mg/dL — AB
Hgb urine dipstick: NEGATIVE
Ketones, ur: 80 mg/dL — AB
Leukocytes,Ua: NEGATIVE
Nitrite: NEGATIVE
Protein, ur: 100 mg/dL — AB
Specific Gravity, Urine: 1.016 (ref 1.005–1.030)
pH: 5 (ref 5.0–8.0)

## 2020-07-27 LAB — COMPREHENSIVE METABOLIC PANEL
ALT: 109 U/L — ABNORMAL HIGH (ref 0–44)
AST: 36 U/L (ref 15–41)
Albumin: 3.1 g/dL — ABNORMAL LOW (ref 3.5–5.0)
Alkaline Phosphatase: 90 U/L (ref 38–126)
Anion gap: 13 (ref 5–15)
BUN: 9 mg/dL (ref 6–20)
CO2: 22 mmol/L (ref 22–32)
Calcium: 9.2 mg/dL (ref 8.9–10.3)
Chloride: 104 mmol/L (ref 98–111)
Creatinine, Ser: 0.81 mg/dL (ref 0.61–1.24)
GFR calc non Af Amer: >60 mL/min (ref >60)
Glucose, Bld: 242 mg/dL — ABNORMAL HIGH (ref 70–99)
Potassium: 3.6 mmol/L (ref 3.5–5.1)
Sodium: 139 mmol/L (ref 135–145)
Total Bilirubin: 1.3 mg/dL — ABNORMAL HIGH (ref 0.3–1.2)
Total Protein: 7.1 g/dL (ref 6.5–8.1)

## 2020-07-27 LAB — CBG MONITORING, ED
Glucose-Capillary: 133 mg/dL — ABNORMAL HIGH (ref 70–99)
Glucose-Capillary: 146 mg/dL — ABNORMAL HIGH (ref 70–99)

## 2020-07-27 LAB — RESPIRATORY PANEL BY RT PCR (FLU A&B, COVID)
Influenza A by PCR: NEGATIVE
Influenza B by PCR: NEGATIVE
SARS Coronavirus 2 by RT PCR: POSITIVE — AB

## 2020-07-27 LAB — GLUCOSE, CAPILLARY: Glucose-Capillary: 100 mg/dL — ABNORMAL HIGH (ref 70–99)

## 2020-07-27 LAB — RAPID URINE DRUG SCREEN, HOSP PERFORMED
Amphetamines: NOT DETECTED
Barbiturates: NOT DETECTED
Benzodiazepines: NOT DETECTED
Cocaine: NOT DETECTED
Opiates: NOT DETECTED
Tetrahydrocannabinol: NOT DETECTED

## 2020-07-27 LAB — HEMOGLOBIN A1C
Hgb A1c MFr Bld: 6.9 % — ABNORMAL HIGH (ref 4.8–5.6)
Mean Plasma Glucose: 151.33 mg/dL

## 2020-07-27 LAB — MAGNESIUM
Magnesium: 1.6 mg/dL — ABNORMAL LOW (ref 1.7–2.4)
Magnesium: 1.7 mg/dL (ref 1.7–2.4)

## 2020-07-27 LAB — LACTIC ACID, PLASMA
Lactic Acid, Venous: 2.4 mmol/L (ref 0.5–1.9)
Lactic Acid, Venous: 1.5 mmol/L (ref 0.5–1.9)

## 2020-07-27 MED ORDER — LORAZEPAM 2 MG/ML IJ SOLN
1.0000 mg | INTRAMUSCULAR | Status: AC
  Administered 2020-07-28: 1 mg via INTRAVENOUS
  Filled 2020-07-27: qty 1

## 2020-07-27 MED ORDER — INSULIN ASPART 100 UNIT/ML ~~LOC~~ SOLN
0.0000 [IU] | Freq: Three times a day (TID) | SUBCUTANEOUS | Status: DC
  Administered 2020-07-27: 1 [IU] via SUBCUTANEOUS

## 2020-07-27 MED ORDER — ADULT MULTIVITAMIN W/MINERALS CH
1.0000 | ORAL_TABLET | Freq: Every day | ORAL | Status: DC
  Administered 2020-07-28 – 2020-07-29 (×2): 1 via ORAL
  Filled 2020-07-27 (×2): qty 1

## 2020-07-27 MED ORDER — HYDROXYZINE HCL 10 MG PO TABS
10.0000 mg | ORAL_TABLET | Freq: Four times a day (QID) | ORAL | Status: DC | PRN
  Filled 2020-07-27 (×2): qty 1

## 2020-07-27 MED ORDER — VITAMIN D (ERGOCALCIFEROL) 1.25 MG (50000 UNIT) PO CAPS: 50000.0000 [IU] | ORAL_CAPSULE | ORAL | Status: DC

## 2020-07-27 MED ORDER — ONDANSETRON HCL 4 MG/2ML IJ SOLN: 4.0000 mg | Freq: Four times a day (QID) | INTRAMUSCULAR | Status: DC | PRN

## 2020-07-27 MED ORDER — ACETAMINOPHEN 650 MG RE SUPP: 650.0000 mg | Freq: Four times a day (QID) | RECTAL | Status: DC | PRN

## 2020-07-27 MED ORDER — ASPIRIN 325 MG PO TABS
325.0000 mg | ORAL_TABLET | Freq: Every day | ORAL | Status: DC
  Administered 2020-07-28 – 2020-07-29 (×2): 325 mg via ORAL
  Filled 2020-07-27 (×2): qty 1

## 2020-07-27 MED ORDER — ENOXAPARIN SODIUM 40 MG/0.4ML ~~LOC~~ SOLN
40.0000 mg | SUBCUTANEOUS | Status: DC
  Administered 2020-07-27: 40 mg via SUBCUTANEOUS
  Filled 2020-07-27: qty 0.4

## 2020-07-27 MED ORDER — LACTATED RINGERS IV SOLN: INTRAVENOUS | Status: AC

## 2020-07-27 MED ORDER — LORAZEPAM 2 MG/ML IJ SOLN: 1.0000 mg | Freq: Once | INTRAMUSCULAR | Status: DC | PRN

## 2020-07-27 MED ORDER — LISINOPRIL 2.5 MG PO TABS
2.5000 mg | ORAL_TABLET | Freq: Every day | ORAL | Status: DC
  Administered 2020-07-28 – 2020-07-29 (×2): 2.5 mg via ORAL
  Filled 2020-07-27 (×2): qty 1

## 2020-07-27 MED ORDER — PANTOPRAZOLE SODIUM 40 MG PO TBEC
40.0000 mg | DELAYED_RELEASE_TABLET | Freq: Every day | ORAL | Status: DC
  Administered 2020-07-28 – 2020-07-29 (×2): 40 mg via ORAL
  Filled 2020-07-27 (×2): qty 1

## 2020-07-27 MED ORDER — ONDANSETRON HCL 4 MG PO TABS: 4.0000 mg | ORAL_TABLET | Freq: Four times a day (QID) | ORAL | Status: DC | PRN

## 2020-07-27 MED ORDER — LORAZEPAM 2 MG/ML IJ SOLN
INTRAMUSCULAR | Status: AC
  Administered 2020-07-27: 1 mg
  Filled 2020-07-27: qty 1

## 2020-07-27 MED ORDER — ACETAMINOPHEN 325 MG PO TABS
650.0000 mg | ORAL_TABLET | Freq: Four times a day (QID) | ORAL | Status: DC | PRN
  Administered 2020-07-28: 650 mg via ORAL
  Filled 2020-07-27 (×2): qty 2

## 2020-07-27 NOTE — ED Provider Notes (Signed)
[facility] EMERGENCY DEPARTMENT Provider Note   CSN: [id number] Arrival date & time: 07/27/20  0749     History Chief Complaint  Patient presents with   Seizures    Isaiah Spencer is a 54 y.o. male past medical history of recent COVID-19 illness, diabetes, presenting to the emergency department with seizure-like activity.  She had recent COVID-19 illness last month, tested positive on September 22, symptoms are improving.  Patient's significant other provides history of event today.  She states the last couple of days he has had some intermittent confusion such as not recalling the events of the day.  Last night he was quite paranoid, and did not sleep overnight.  This morning she states he was sitting on the bed looking at photos when he cried out, his body stiffened and then she caught him as she thought he was going to fall.  He then had full body convulsions.  He was drooling during that time and had some snoring afterwards, no incontinence.  He had a postictal state that resolved in route to the ED.  He is at his mental baseline now.  No history of seizure disorder, no particular family history of seizure disorder other than one episode that occurred in his 69-year-old son, unsure if this was during an episode of illness. Is currently asymptomatic. Last alcohol use was in September before his Covid diagnosis, he is not a regular alcohol drinker.  Denies any drug use. The history is provided by the patient and the spouse.       Past Medical History:  Diagnosis Date   Allergy    Asthma    AS A CHILD   COVID-19    Diabetes mellitus without complication (HCC)    GERD (gastroesophageal reflux disease)    H. pylori infection    Hemorrhoids    Hyperlipemia    OSA (obstructive sleep apnea)    does not use c pap    Patient Active Problem List   Diagnosis Date Noted   Dyslipidemia 11/20/2018   Chest discomfort 01/21/2014   OSA (obstructive sleep  apnea) 11/02/2012   Diabetes mellitus, type 2 (HCC) 07/30/2012   Dysphagia, unspecified(787.20) 12/16/2011   GERD (gastroesophageal reflux disease) 12/07/2011   H. pylori infection 12/07/2011   Internal hemorrhoids 07/10/2011    Past Surgical History:  Procedure Laterality Date   EXAMINATION UNDER ANESTHESIA  11/21/2011   Procedure: EXAM UNDER ANESTHESIA;  Surgeon: Atilano Ina, MD;  Location: [facility] OR;  Service: General;  Laterality: N/A;   HEMORRHOID SURGERY  11/21/2011   Procedure: HEMORRHOIDECTOMY;  Surgeon: Atilano Ina, MD;  Location: [facility] OR;  Service: General;  Laterality: N/A;  Excisional hemorrhoidectomy times two   SUPERFICIAL LYMPH NODE BIOPSY / EXCISION  90's   non cancerous       Family History  Problem Relation Age of Onset   Heart disease Mother 42       heart attack    Diabetes Mother    Hypertension Mother    Colon cancer Neg Hx    Rectal cancer Neg Hx    Stomach cancer Neg Hx    Esophageal cancer Neg Hx     Social History   Tobacco Use   Smoking status: Never Smoker   Smokeless tobacco: Never Used  Vaping Use   Vaping Use: Never used  Substance Use Topics   Alcohol use: Yes    Alcohol/week: 2.0 standard drinks    Types: 2 drink(s) per week  Comment: socially   Drug use: No    Home Medications Prior to Admission medications   Medication Sig Start Date End Date Taking? Authorizing Provider  aspirin 325 MG tablet Take 325 mg by mouth daily.   Yes [provider]  docusate sodium (COLACE) 100 MG capsule Take 100 mg by mouth daily.   Yes [provider]  ergocalciferol (VITAMIN D2) 1.25 MG (50000 UT) capsule Take 50,000 Units by mouth once a week. Tuesday 07/24/20  Yes [provider]  lisinopril (ZESTRIL) 2.5 MG tablet Take 2.5 mg by mouth daily. 05/10/20  Yes [provider]  metFORMIN (GLUCOPHAGE) 1000 MG tablet Take 1,000 mg by mouth 2 (two) times daily. 06/23/20  Yes [provider]    Multiple Vitamins-Minerals (MULTI FOR HIM 50+ PO) Take 1 tablet by mouth daily.   Yes [provider]  omeprazole (PRILOSEC) 40 MG capsule Take 40 mg by mouth daily. 03/10/20  Yes [provider]  cyclobenzaprine (FLEXERIL) 10 MG tablet Take 1 tablet (10 mg total) by mouth 2 (two) times daily as needed for muscle spasms. Patient not taking: Reported on 07/27/2020 03/01/19   Tegeler, Canary Brimhristopher J, MD  lidocaine (LIDODERM) 5 % Place 1 patch onto the skin daily. Remove & Discard patch within 12 hours or as directed by MD Patient not taking: Reported on 07/27/2020 03/01/19   Tegeler, Canary Brimhristopher J, MD    Allergies    Amoxicillin-pot clavulanate and Penicillins  Review of Systems   Review of Systems  Neurological: Positive for seizures.  All other systems reviewed and are negative.   Physical Exam Updated Vital Signs BP (!) 142/86    Pulse 98    Temp 98.2 F (36.8 C)    Resp 20    Ht 6\' 1"  (1.854 m)    Wt 85.7 kg    SpO2 95%    BMI 24.94 kg/m   Physical Exam Vitals and nursing note reviewed.  Constitutional:      General: He is not in acute distress.    Appearance: He is well-developed. He is not ill-appearing.  HENT:     Head: Normocephalic and atraumatic.  Eyes:     Extraocular Movements: Extraocular movements intact.     Conjunctiva/sclera: Conjunctivae normal.     Pupils: Pupils are equal, round, and reactive to light.  Cardiovascular:     Rate and Rhythm: Normal rate and regular rhythm.  Pulmonary:     Effort: Pulmonary effort is normal. No respiratory distress.     Breath sounds: Normal breath sounds.  Abdominal:     General: Bowel sounds are normal.     Palpations: Abdomen is soft.     Tenderness: There is no abdominal tenderness.  Musculoskeletal:     Right lower leg: No edema.     Left lower leg: No edema.  Skin:    General: Skin is warm.  Neurological:     General: No focal deficit present.     Mental Status: He is alert.     Comments: Mental  Status:  Alert, oriented, thought content appropriate, able to give a coherent history. Speech fluent without evidence of aphasia. Able to follow 2 step commands without difficulty.  Cranial Nerves:  II:  pupils equal, round, reactive to light III,IV, VI: ptosis not present, extra-ocular motions intact bilaterally  V,VII: smile symmetric, facial light touch sensation equal VIII: hearing grossly normal to voice  X: uvula elevates symmetrically  XI: bilateral shoulder shrug symmetric and strong XII: midline  tongue extension without fassiculations Motor:  Normal tone. 5/5 strength in upper and lower extremities bilaterally including strong and equal grip strength and dorsiflexion/plantar flexion Sensory: grossly normal in all extremities.  Cerebellar: normal finger-to-nose with bilateral upper extremities CV: distal pulses palpable throughout    Psychiatric:        Behavior: Behavior normal.     ED Results / Procedures / Treatments   Labs (all labs ordered are listed, but only abnormal results are displayed) Labs Reviewed  COMPREHENSIVE METABOLIC PANEL - Abnormal; Notable for the following components:      Result Value   Glucose, Bld 242 (*)    Albumin 3.1 (*)    ALT 109 (*)    Total Bilirubin 1.3 (*)    All other components within normal limits  CBC WITH DIFFERENTIAL/PLATELET - Abnormal; Notable for the following components:   Platelets 548 (*)    All other components within normal limits  LACTIC ACID, PLASMA - Abnormal; Notable for the following components:   Lactic Acid, Venous 2.4 (*)    All other components within normal limits  URINALYSIS, ROUTINE W REFLEX MICROSCOPIC - Abnormal; Notable for the following components:   Glucose, UA 50 (*)    Ketones, ur 80 (*)    Protein, ur 100 (*)    All other components within normal limits  MAGNESIUM - Abnormal; Notable for the following components:   Magnesium 1.6 (*)    All other components within normal limits  LACTIC ACID,  PLASMA  RAPID URINE DRUG SCREEN, HOSP PERFORMED  MAGNESIUM  HEMOGLOBIN A1C    EKG EKG Interpretation  Date/Time:  Thursday July 27 2020 07:51:39 EDT Ventricular Rate:  106 PR Interval:    QRS Duration: 97 QT Interval:  354 QTC Calculation: 471 R Axis:   70 Text Interpretation: Sinus tachycardia Borderline prolonged PR interval rate is a little faster compared to 2018 Confirmed by Pricilla Loveless [phone]) on 07/27/2020 9:09:31 AM   Radiology EEG  Result Date: 07/27/2020 Charlsie Quest, MD     07/27/2020  2:53 PM Patient Name: Isaiah Spencer MRN: [id number] Epilepsy Attending: Charlsie Quest Referring Physician/Provider: Dr. Caryl Pina Date: 07/27/2020 Duration: 26.31 mins Patient history: 54 year old male with new onset seizures.  EEG to evaluate for seizures. Level of alertness: Awake, asleep AEDs during EEG study: None Technical aspects: This EEG study was done with scalp electrodes positioned according to the 10-20 International system of electrode placement. Electrical activity was acquired at a sampling rate of 500Hz  and reviewed with a high frequency filter of 70Hz  and a low frequency filter of 1Hz . EEG data were recorded continuously and digitally stored. Description: The posterior dominant rhythm consists of 10 Hz activity of moderate voltage (25-35 uV) seen predominantly in posterior head regions, symmetric and reactive to eye opening and eye closing. Sleep was characterized by vertex waves, sleep spindles (12 to 14 Hz), maximal frontocentral region.  Sharp transients were seen in left frontocentral region at times periodic at 1 Hz without definite evolution predominantly during sleep and improved once patient woke up. The morphology and distribution as well as presence in sleep with normal awake EEG make it suspicious for them being artifactual in nature.  Physiology photic driving was seen during photic stimulation.  Hyperventilation was not performed.   IMPRESSION: This study is  within normal limits. No seizures or definite epileptiform discharges were seen throughout the recording.  Sharp transients were seen in left frontocentral region which seem to be artifactual in nature.  Recommend repeat EEG if concern for ictal-interictal activity persists. Charlsie Quest   CT Head Wo Contrast  Result Date: 07/27/2020 CLINICAL DATA:  Seizure EXAM: CT HEAD WITHOUT CONTRAST TECHNIQUE: Contiguous axial images were obtained from the base of the skull through the vertex without intravenous contrast. COMPARISON:  2015 FINDINGS: Brain: There is no acute intracranial hemorrhage, mass effect, or edema. Gray-white differentiation is preserved. There is no extra-axial fluid collection. Ventricles and sulci are within normal limits in size and configuration. Vascular: No hyperdense vessel or unexpected calcification. Skull: Calvarium is unremarkable. Sinuses/Orbits: No acute finding. Other: None. IMPRESSION: No acute intracranial abnormality. Electronically Signed   By: Guadlupe Spanish M.D.   On: 07/27/2020 10:40   DG Chest Port 1 View  Result Date: 07/27/2020 CLINICAL DATA:  Seizure.  Recent COVID-19 positive EXAM: PORTABLE CHEST 1 VIEW COMPARISON:  July 19, 2020 FINDINGS: There is subtle ill-defined opacity in portions of the left mid and lower lung regions without consolidation. Heart size and pulmonary vascularity are normal. No adenopathy. No bone lesions. IMPRESSION: Ill-defined opacity in mid and lower lung zones again noted, likely due to atypical organism pneumonia. No consolidation. Lungs otherwise clear. Heart size normal. No adenopathy. Electronically Signed   By: Bretta Bang III M.D.   On: 07/27/2020 09:18    Procedures Procedures (including critical care time)  Medications Ordered in ED Medications  LORazepam (ATIVAN) injection 1 mg (has no administration in time range)  insulin aspart (novoLOG) injection 0-9 Units (has no administration in time range)    ED Course   I have reviewed the triage vital signs and the nursing notes.  Pertinent labs & imaging results that were available during my care of the patient were reviewed by me and considered in my medical decision making (see chart for details).  Clinical Course as of Jul 28 1543  Thu Jul 27, 2020  1511 MRI brain appears to have been canceled somehow.  Reordered.  MRI made aware.   [JR]    Clinical Course User Index [JR] Didi Ganaway, Spencer N, PA-C   MDM Rules/Calculators/A&P                          Patient presenting to the ED after seizure-like episode.  Recent COVID-19 illness, with improved symptoms, tested positive on September 22.  Episode today involved full body stiffening followed by full body convulsions with drooling followed by agonal breathing in a postictal state.  He is at baseline on evaluation, no current symptoms.  He did not sleep last night attributed to paranoia.  No focal neuro deficits on exam.  Laboratory work-up reveals hyperglycemia of 242, thrombocytosis of 548, initial lactic acid is elevated, repeat is normalized.  UDS is negative.  Head CT is negative, chest x-ray with persistent opacities.  Consulted with Dr. Otelia Limes with neurology.  Recommends continue seizure work-up with MRI and EEG, admission.  Appreciate consult.  Patient accepted for admission by Dr. Lafe Garin.   Final Clinical Impression(s) / ED Diagnoses Final diagnoses:  Seizure-like activity [facility])    Rx / DC Orders ED Discharge Orders    None       Fumi Guadron, Spencer N, PA-C 07/27/20 1545    Pricilla Loveless, MD 08/01/20 [phone]

## 2020-07-27 NOTE — ED Provider Notes (Signed)
Called bedside for seizure activity. Patient significant other was in the room with him and he was on the phone with his daughter. When he was on the phone he handed the phone over to his significant other, had his head turn to the side and then his arms pulled up towards him and he went unresponsive. Episode lasted less than a minute. On staff arrival to the room patient with heavy, sonorous respirations, tachycardia and unresponsiveness. He was treated with Ativan, 1 mg. Episode did not appear to be psychogenic in origin.     Tilden Fossa, MD 07/27/20 [phone]

## 2020-07-27 NOTE — ED Notes (Addendum)
The Pt significant other came out the room yelling help, This RN and 2 other nurses plus a tech walk in and  pt having a seizure lasted about 2 mins, Pt was immediatly turn to the right side, Oxygen was place 4L [place] on pt and suction was set up. MD was called to bedside side orders of 1mg  of Ativan to be administered to pt. Seizure precautions are in place.. MD reports will notified Neuro MD   Before Seizure this RN was in pt room 2 mins prior where pt reports being "frightful" from when he had covid about a week ago and states he noticing everyone around him has covid and people are dying"   1648 Pt remains in post ictal phase

## 2020-07-27 NOTE — Procedures (Addendum)
Patient Name: Isaish D [place]  MRN: [id number]  Epilepsy Attending: Charlsie Quest  Referring Physician/Provider: Dr. Caryl Pina Date: 07/27/2020 Duration: 26.31 mins  Patient history: 54 year old male with new onset seizures.  EEG to evaluate for seizures.  Level of alertness: Awake, asleep  AEDs during EEG study: None  Technical aspects: This EEG study was done with scalp electrodes positioned according to the 10-20 International system of electrode placement. Electrical activity was acquired at a sampling rate of 500Hz  and reviewed with a high frequency filter of 70Hz  and a low frequency filter of 1Hz . EEG data were recorded continuously and digitally stored.   Description: The posterior dominant rhythm consists of 10 Hz activity of moderate voltage (25-35 uV) seen predominantly in posterior head regions, symmetric and reactive to eye opening and eye closing. Sleep was characterized by vertex waves, sleep spindles (12 to 14 Hz), maximal frontocentral region.  Sharp transients were seen in left frontocentral region at times periodic at 1 Hz without definite evolution predominantly during sleep and improved once patient woke up. The morphology and distribution as well as presence in sleep with normal awake EEG make it suspicious for them being artifactual in nature.  Physiology photic driving was seen during photic stimulation.  Hyperventilation was not performed.     IMPRESSION: This study is within normal limits. No seizures or definite epileptiform discharges were seen throughout the recording.  Sharp transients were seen in left frontocentral region which seem to be artifactual in nature.  Recommend repeat EEG if concern for ictal-interictal activity persists.  Devansh Riese 

## 2020-07-27 NOTE — Consult Note (Addendum)
NEURO HOSPITALIST CONSULT NOTE   Requesting physician: Dr. Criss Alvine  Reason for Consult:New onset seizure  History obtained from:  Patient     HPI:                                                                                                                                          Isaiah Spencer is a 54 y.o. male with a history of obstructive sleep apnea, hyperlipidemia, recent Covid-19 PNA and DM2.  Per significant other, over the last 2 days she has noticed that he has been having difficulty recalling periods of time.  For instance he forgot the time that he had spent with his daughter which was approximately 1 hour, he forgot that she had left the house.  This has occurred enough times over the last few days such that he has become very aware and also "scared" of what is going on.  Per significant other last night he did not sleep all night long due to fear of falling asleep.  This morning he was noted to suddenly have a blank stare followed by stiffness of all extremities and then tonic-clonic seizure with foaming at the mouth that lasted for approximately 5 minutes. He bit the right side of his tongue, but otherwise no lateralized activity with the GTC seizure. EMS was called and patient was brought to [facility].  Significant other states that once he had gotten here he seemed to be acting his baseline.  During consultation he still did not seem to be back to baseline.  While a conversation was clearly being had with both significant other and EEG tech.  While our conversation was fluent and coherent he was in the background continuing to talk about his Covid experience and not understanding that he was totally tangential with his conversation.  In addition some lateralized EEG changes were noted.  Patient states that he has never had a concussion. He was a normal birth. He consumes energy drinks but otherwise does not use stimulants. He does not use any form of  benzodiazepine or narcotics.  He continued to focus on the fact that he takes Viagra.  I explained to him that that is not a narcotic or illicit drug.  He did not seem to grasp that fact.  CT head: No acute intracranial abnormality.  Past Medical History:  Diagnosis Date  . Allergy   . Asthma    AS A CHILD  . COVID-19   . Diabetes mellitus without complication (HCC)   . GERD (gastroesophageal reflux disease)   . H. pylori infection   . Hemorrhoids   . Hyperlipemia   . OSA (obstructive sleep apnea)    does not use c pap    Past Surgical History:  Procedure Laterality Date  .  EXAMINATION UNDER ANESTHESIA  11/21/2011   Procedure: EXAM UNDER ANESTHESIA;  Surgeon: Atilano Ina, MD;  Location: [facility] OR;  Service: General;  Laterality: N/A;  . HEMORRHOID SURGERY  11/21/2011   Procedure: HEMORRHOIDECTOMY;  Surgeon: Atilano Ina, MD;  Location: [facility] OR;  Service: General;  Laterality: N/A;  Excisional hemorrhoidectomy times two  . SUPERFICIAL LYMPH NODE BIOPSY / EXCISION  90's   non cancerous    Family History  Problem Relation Age of Onset  . Heart disease Mother 26       heart attack   . Diabetes Mother   . Hypertension Mother   . Colon cancer Neg Hx   . Rectal cancer Neg Hx   . Stomach cancer Neg Hx   . Esophageal cancer Neg Hx       Social History:  reports that he has never smoked. He has never used smokeless tobacco. He reports current alcohol use of about 2.0 standard drinks of alcohol per week. He reports that he does not use drugs.  Allergies  Allergen Reactions  . Amoxicillin-Pot Clavulanate Other (See Comments)    Upsets stomach  . Penicillins Hives    Breaks out in hives Has patient had a PCN reaction causing immediate rash, facial/tongue/throat swelling, SOB or lightheadedness with hypotension: No Has patient had a PCN reaction causing severe rash involving mucus membranes or skin necrosis: No Has patient had a PCN reaction that required hospitalization: No Has  patient had a PCN reaction occurring within the last 10 years: No If all of the above answers are "NO", then may proceed with Cephalosporin use.     MEDICATIONS:                                                                                                                     Prior to Admission: Aspirin, Colace, Zestril, Glucophage, Prilosec, Flexeril, Viagra as needed.  Scheduled: . insulin aspart  0-9 Units Subcutaneous TID WC     ROS:                                                                                                                                       History obtained from the patient  General ROS: negative for - chills, fatigue, fever, night sweats, weight gain or weight loss Psychological ROS: Positive for - memory difficulties Ophthalmic ROS: negative for - blurry vision, double vision, eye pain or  loss of vision ENT ROS: negative for - epistaxis, nasal discharge, oral lesions, sore throat, tinnitus or vertigo Allergy and Immunology ROS: negative for - hives or itchy/watery eyes Hematological and Lymphatic ROS: negative for - bleeding problems, bruising or swollen lymph nodes Endocrine ROS: negative for - galactorrhea, hair pattern changes, polydipsia/polyuria or temperature intolerance Respiratory ROS: negative for - cough, hemoptysis, shortness of breath or wheezing Cardiovascular ROS: negative for - chest pain, dyspnea on exertion, edema or irregular heartbeat Gastrointestinal ROS: negative for - abdominal pain, diarrhea, hematemesis, nausea/vomiting or stool incontinence Genito-Urinary ROS: negative for - dysuria, hematuria, incontinence or urinary frequency/urgency Musculoskeletal ROS: negative for - joint swelling or muscular weakness Neurological ROS: as noted in HPI Dermatological ROS: negative for rash and skin lesion changes   Blood pressure (!) 142/91, pulse (!) 103, temperature 98.2 F (36.8 C), resp. rate (!) 22, height 6\' 1"  (1.854 m), weight 85.7  kg, SpO2 97 %.  General Examination:                                                                                                       Physical Exam  HEENT-  Normocephalic, no lesions, without obvious abnormality.  Normal external eye and conjunctiva.  Right tongue bite noted.  Cardiovascular- S1-S2 audible, pulses palpable throughout   Lungs-no rhonchi or wheezing noted, no excessive working breathing.  Saturations within normal limits Abdomen- All 4 quadrants palpated and nontender Extremities- Warm, dry and intact Musculoskeletal-no joint tenderness, deformity or swelling Skin-warm and dry, no hyperpigmentation, vitiligo, or suspicious lesions  Neurological Examination Mental Status: PA exam: Alert, oriented, thought content tangential.  Speech fluent without evidence of aphasia.  Able to follow 3 step commands without difficulty. Follow up exam by attending: Alert and fully oriented with naming and comprehension intact for all commands. However, speech is pressured with anxious affect that is nearly tearful at times. Mood is somewhat labile. Speech is tangential at times. He seemed to have difficulty with word finding at times that increased with apparent anxiety and the degree to which his speech was pressured.  Cranial Nerves: II: Visual fields grossly normal. PERRL III,IV, VI: ptosis not present, EOMI V,VII: smile symmetric, facial light touch sensation normal bilaterally VIII: hearing intact to voice IX,X: No hypophonia XI: Symmetric shoulder shrug XII: midline tongue extension Motor: Right : Upper extremity   5/5    Left:     Upper extremity   5/5  Lower extremity   5/5     Lower extremity   5/5 Tone and bulk:normal tone throughout; no atrophy noted Sensory: Pinprick and light touch intact throughout, bilaterally Deep Tendon Reflexes: 2+ and symmetric throughout Plantars: Right: downgoing   Left: downgoing Cerebellar: normal finger-to-nose, normal rapid alternating  movements and normal heel-to-shin test    Lab Results: Basic Metabolic Panel: Recent Labs  Lab 07/27/20 0906 07/27/20 1055  NA 139  --   K 3.6  --   CL 104  --   CO2 22  --   GLUCOSE 242*  --   BUN 9  --  CREATININE 0.81  --   CALCIUM 9.2  --   MG 1.6* 1.7    CBC: Recent Labs  Lab 07/27/20 0906  WBC 7.3  NEUTROABS 5.9  HGB 13.8  HCT 40.9  MCV 89.9  PLT 548*    Cardiac Enzymes: No results for input(s): CKTOTAL, CKMB, CKMBINDEX, TROPONINI in the last 168 hours.  Lipid Panel: No results for input(s): CHOL, TRIG, HDL, CHOLHDL, VLDL, LDLCALC in the last 168 hours.  Imaging:  EEG IMPRESSION: This study is within normal limits. No seizures or definite epileptiform discharges were seen throughout the recording.  Sharp transients were seen in left frontocentral region which seem to be artifactual in nature.  Recommend repeat EEG if concern for ictal-interictal activity persists.    CT Head Wo Contrast  Result Date: 07/27/2020 CLINICAL DATA:  Seizure EXAM: CT HEAD WITHOUT CONTRAST TECHNIQUE: Contiguous axial images were obtained from the base of the skull through the vertex without intravenous contrast. COMPARISON:  2015 FINDINGS: Brain: There is no acute intracranial hemorrhage, mass effect, or edema. Gray-white differentiation is preserved. There is no extra-axial fluid collection. Ventricles and sulci are within normal limits in size and configuration. Vascular: No hyperdense vessel or unexpected calcification. Skull: Calvarium is unremarkable. Sinuses/Orbits: No acute finding. Other: None. IMPRESSION: No acute intracranial abnormality. Electronically Signed   By: Guadlupe SpanishPraneil  Patel M.D.   On: 07/27/2020 10:40   DG Chest Port 1 View  Result Date: 07/27/2020 CLINICAL DATA:  Seizure.  Recent COVID-19 positive EXAM: PORTABLE CHEST 1 VIEW COMPARISON:  July 19, 2020 FINDINGS: There is subtle ill-defined opacity in portions of the left mid and lower lung regions without  consolidation. Heart size and pulmonary vascularity are normal. No adenopathy. No bone lesions. IMPRESSION: Ill-defined opacity in mid and lower lung zones again noted, likely due to atypical organism pneumonia. No consolidation. Lungs otherwise clear. Heart size normal. No adenopathy. Electronically Signed   By: Bretta BangWilliam  Woodruff III M.D.   On: 07/27/2020 09:18    Assessment: 54 year old male with new onset seizure 1. Exam reveals no localizing or lateralizing abnormalities however patient did show difficulty focusing on conversation and being aware that he was talking about something completely different than the conversation that was being held in the room. 2. CT head is normal.  3. EEG: Sharp transients were seen in left frontocentral region which seem to be artifactual in nature but may also be abnormal.  Recommend repeat EEG if concern for ictal-interictal activity persists. 4. May be a lesional epilepsy or a provoked first time seizure due to sleep deprivation.    Recommendations: 1. LTM EEG 2. MRI brain with and without contrast 3. Atarax for anxiety prior to MRI.  4. Hold off on seizure medication for now  I have seen and examined the patient. I have formulated the assessment and recommendations. 54 year old male with new onset seizure. CT head normal. EEG with possible focal abnormality. Obtaining MRI brain and LTM EEG.  Electronically signed: Dr. Caryl PinaEric Larri Yehle 07/27/2020, 1:09 PM

## 2020-07-27 NOTE — ED Notes (Signed)
Pt denies drug and ETOH use last night and this morning.  Pt does not appear in distress, respirations are even and non-labored  Skin is warm, dry and intact.   Pt states last night he felt that he was going to die and was afraid to go to sleep  Denies hallucinations.

## 2020-07-27 NOTE — ED Notes (Signed)
Pt arrived by EMS after having a witnesses seizure at home, family states seizure activity for . Pt states he was very paranoid last night and felt that he could not go to sleep, because of [place] neighborhood. States he doe not normally get scared.   Denies pain at this time.   diagnosed with covid 9/22 and pneumonia a week later, has finish antibiotics

## 2020-07-27 NOTE — ED Notes (Signed)
Pt resting comfortably in bed. Denies pain and shortness of breath at this time Does not appear in distress, respirations are even and non-labored Skin is warm, dry and intact. A&Ox4   Denies further needs at this time, call light within reach. Significant other remains at bedside.

## 2020-07-27 NOTE — H&P (Signed)
History and Physical    Isaiah Spencer XLK:[id number] DOB: January 27, 1966 DOA: 07/27/2020  I have briefly reviewed the patient's prior medical records in [facility]  PCP: Lenord Fellers, [place]  Patient coming from: home  Chief Complaint: seizure  HPI: Isaiah Spencer is a 54 y.o. male with medical history significant of DM2, OSA, HTN, HLD, recent Covid on 9/22 s/p mAb not recovered, comes to the hospital with a seizure. Patient denies any prior history of seizures.  Girlfriend is at bedside and states that patient has been more forgetful in the past couple of days, very anxious, very fearful, trying to stay awake at night because he was afraid to fall asleep.  He has been under a lot of stress recently, had a family member die of Covid and he himself had Covid and it appears that he was very scared.  Today he had what looks like a generalized tonic-clonic seizure based on descriptions, lasted approximately 5 minutes and he bit his right side of the tongue and he was also found to be foaming at the mouth.  He remains very anxious in the ED constantly watching the monitor and worrying about his blood pressure. He denies heavy alcohol intake and [place] the [place].  No drugs. 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  Denies any headaches  ED Course: In the ED he is afebrile, normotensive and satting well on room air. Labs are relatively unremarkable, lactic acid was mildly elevated but improved with fluids. Glucose 242. Mg 1.6. UA unremarkable, UDS negative. CT brain negative. Neurology was consulted and we are asked to admit.       Review of Systems: All systems reviewed, and apart from HPI, all negative  Past Medical History:  Diagnosis Date  . Allergy   . Asthma    AS A CHILD  . COVID-19   . Diabetes  mellitus without complication (HCC)   . GERD (gastroesophageal reflux disease)   . H. pylori infection   . Hemorrhoids   . Hyperlipemia   . OSA (obstructive sleep apnea)    does not use c pap    Past Surgical History:  Procedure Laterality Date  . EXAMINATION UNDER ANESTHESIA  11/21/2011   Procedure: EXAM UNDER ANESTHESIA;  Surgeon: Atilano Ina, MD;  Location: [facility] OR;  Service: General;  Laterality: N/A;  . HEMORRHOID SURGERY  11/21/2011   Procedure: HEMORRHOIDECTOMY;  Surgeon: Atilano Ina, MD;  Location: [facility] OR;  Service: General;  Laterality: N/A;  Excisional hemorrhoidectomy times two  . SUPERFICIAL LYMPH NODE BIOPSY / EXCISION  90's   non cancerous     reports that he has never smoked. He has never used smokeless tobacco. He reports current alcohol use of about 2.0 standard drinks of alcohol per week. He reports that he does not use drugs.  Allergies  Allergen Reactions  . Amoxicillin-Pot Clavulanate Other (See Comments)    Upsets stomach  . Penicillins Hives    Breaks out in hives Has patient had a PCN reaction causing immediate rash, facial/tongue/throat swelling, SOB or lightheadedness with hypotension: No Has patient had a PCN reaction causing severe rash involving mucus membranes or skin necrosis: No Has patient had a PCN reaction that required hospitalization: No Has patient had a PCN  reaction occurring within the last 10 years: No If all of the above answers are "NO", then may proceed with Cephalosporin use.     Family History  Problem Relation Age of Onset  . Heart disease Mother 7254       heart attack   . Diabetes Mother   . Hypertension Mother   . Colon cancer Neg Hx   . Rectal cancer Neg Hx   . Stomach cancer Neg Hx   . Esophageal cancer Neg Hx     Prior to Admission medications   Medication Sig Start Date End Date Taking? Authorizing Provider  aspirin 325 MG tablet Take 325 mg by mouth daily.   Yes [provider]  docusate sodium (COLACE) 100  MG capsule Take 100 mg by mouth daily.   Yes [provider]  ergocalciferol (VITAMIN D2) 1.25 MG (50000 UT) capsule Take 50,000 Units by mouth once a week. Tuesday 07/24/20  Yes [provider]  lisinopril (ZESTRIL) 2.5 MG tablet Take 2.5 mg by mouth daily. 05/10/20  Yes [provider]  metFORMIN (GLUCOPHAGE) 1000 MG tablet Take 1,000 mg by mouth 2 (two) times daily. 06/23/20  Yes [provider]  Multiple Vitamins-Minerals (MULTI FOR HIM 50+ PO) Take 1 tablet by mouth daily.   Yes [provider]  omeprazole (PRILOSEC) 40 MG capsule Take 40 mg by mouth daily. 03/10/20  Yes [provider]  cyclobenzaprine (FLEXERIL) 10 MG tablet Take 1 tablet (10 mg total) by mouth 2 (two) times daily as needed for muscle spasms. Patient not taking: Reported on 07/27/2020 03/01/19   Tegeler, Canary Brimhristopher J, MD  lidocaine (LIDODERM) 5 % Place 1 patch onto the skin daily. Remove & Discard patch within 12 hours or as directed by MD Patient not taking: Reported on 07/27/2020 03/01/19   Tegeler, Canary Brimhristopher J, MD    Physical Exam: Vitals:   07/27/20 1000 07/27/20 1202 07/27/20 1415 07/27/20 1500  BP: (!) 150/88 (!) 142/91 (!) 148/86 (!) 142/86  Pulse: (!) 101 (!) 103 97 98  Resp: 20 (!) 22 18 20   Temp:  98.2 F (36.8 C)    TempSrc:      SpO2: 97% 97% 94% 95%  Weight:      Height:          Constitutional: No distress but appears a bit anxious Eyes: PERRL, lids and conjunctivae normal ENMT: Mucous membranes are moist. Neck: normal, supple Respiratory: clear to auscultation bilaterally, no wheezing, no crackles. Normal respiratory effort. No accessory muscle use.  Cardiovascular: Regular rate and rhythm, no murmurs / rubs / gallops. No extremity edema. Abdomen: no tenderness, no masses palpated. Bowel sounds positive.  Musculoskeletal: no clubbing / cyanosis. Normal muscle tone.  Skin: no rashes Neurologic: CN 2-12 grossly intact. Strength 5/5 in all 4.     Labs on Admission: I have personally reviewed following labs and imaging studies  CBC: Recent Labs  Lab 07/27/20 0906  WBC 7.3  NEUTROABS 5.9  HGB 13.8  HCT 40.9  MCV 89.9  PLT 548*   Basic Metabolic Panel: Recent Labs  Lab 07/27/20 0906 07/27/20 1055  NA 139  --   K 3.6  --   CL 104  --   CO2 22  --   GLUCOSE 242*  --   BUN 9  --   CREATININE 0.81  --   CALCIUM 9.2  --   MG 1.6* 1.7   Liver Function Tests: Recent Labs  Lab 07/27/20 0906  AST  36  ALT 109*  ALKPHOS 90  BILITOT 1.3*  PROT 7.1  ALBUMIN 3.1*   Coagulation Profile: No results for input(s): INR, PROTIME in the last 168 hours. BNP (last 3 results) No results for input(s): PROBNP in the last 8760 hours. CBG: No results for input(s): GLUCAP in the last 168 hours. Thyroid Function Tests: No results for input(s): TSH, T4TOTAL, FREET4, T3FREE, THYROIDAB in the last 72 hours. Urine analysis:    Component Value Date/Time   COLORURINE YELLOW 07/27/2020 0917   APPEARANCEUR CLEAR 07/27/2020 0917   LABSPEC 1.016 07/27/2020 0917   PHURINE 5.0 07/27/2020 0917   GLUCOSEU 50 (A) 07/27/2020 0917   HGBUR NEGATIVE 07/27/2020 0917   BILIRUBINUR NEGATIVE 07/27/2020 0917   BILIRUBINUR neg 02/16/2014 1905   KETONESUR 80 (A) 07/27/2020 0917   PROTEINUR 100 (A) 07/27/2020 0917   UROBILINOGEN 1.0 02/16/2014 1905   NITRITE NEGATIVE 07/27/2020 0917   LEUKOCYTESUR NEGATIVE 07/27/2020 0917     Radiological Exams on Admission: EEG  Result Date: 07/27/2020 Charlsie Quest, MD     07/27/2020  2:53 PM Patient Name: Isaiah Spencer MRN: [id number] Epilepsy Attending: Charlsie Quest Referring Physician/Provider: Dr. Caryl Pina Date: 07/27/2020 Duration: 26.31 mins Patient history: 54 year old male with new onset seizures.  EEG to evaluate for seizures. Level of alertness: Awake, asleep AEDs during EEG study: None Technical aspects: This EEG study was done with scalp electrodes positioned according to the 10-20  International system of electrode placement. Electrical activity was acquired at a sampling rate of 500Hz  and reviewed with a high frequency filter of 70Hz  and a low frequency filter of 1Hz . EEG data were recorded continuously and digitally stored. Description: The posterior dominant rhythm consists of 10 Hz activity of moderate voltage (25-35 uV) seen predominantly in posterior head regions, symmetric and reactive to eye opening and eye closing. Sleep was characterized by vertex waves, sleep spindles (12 to 14 Hz), maximal frontocentral region.  Sharp transients were seen in left frontocentral region at times periodic at 1 Hz without definite evolution predominantly during sleep and improved once patient woke up. The morphology and distribution as well as presence in sleep with normal awake EEG make it suspicious for them being artifactual in nature.  Physiology photic driving was seen during photic stimulation.  Hyperventilation was not performed.   IMPRESSION: This study is within normal limits. No seizures or definite epileptiform discharges were seen throughout the recording.  Sharp transients were seen in left frontocentral region which seem to be artifactual in nature.  Recommend repeat EEG if concern for ictal-interictal activity persists.   CT Head Wo Contrast  Result Date: 07/27/2020 CLINICAL DATA:  Seizure EXAM: CT HEAD WITHOUT CONTRAST TECHNIQUE: Contiguous axial images were obtained from the base of the skull through the vertex without intravenous contrast. COMPARISON:  2015 FINDINGS: Brain: There is no acute intracranial hemorrhage, mass effect, or edema. Gray-white differentiation is preserved. There is no extra-axial fluid collection. Ventricles and sulci are within normal limits in size and configuration. Vascular: No hyperdense vessel or unexpected calcification. Skull: Calvarium is unremarkable. Sinuses/Orbits: No acute finding. Other: None. IMPRESSION: No acute intracranial  abnormality. Electronically Signed   By: Charlsie Quest M.D.   On: 07/27/2020 10:40   DG Chest Port 1 View  Result Date: 07/27/2020 CLINICAL DATA:  Seizure.  Recent COVID-19 positive EXAM: PORTABLE CHEST 1 VIEW COMPARISON:  July 19, 2020 FINDINGS: There is subtle ill-defined opacity in portions of the left mid and lower lung  regions without consolidation. Heart size and pulmonary vascularity are normal. No adenopathy. No bone lesions. IMPRESSION: Ill-defined opacity in mid and lower lung zones again noted, likely due to atypical organism pneumonia. No consolidation. Lungs otherwise clear. Heart size normal. No adenopathy. Electronically Signed   By: Bretta Bang III M.D.   On: 07/27/2020 09:18    EKG: Independently reviewed.  Sinus rhythm  Assessment/Plan  Principal Problem New onset seizure -Neurology consulted, appreciate input.  Patient underwent an EEG in the ED which showed some sharp transients in the left frontocentral region which can mean potential for epileptiform discharges versus artifacts.  Patient's hands were shaking at that time.  Neurology recommends overnight LT EEG, MRI of the brain.  He is quite anxious about the MRI, will give Atarax to avoid Ativan and mask underlying seizure.  He will probably need AEDs  Active Problems Type 2 diabetes mellitus -Hold oral agents, placed on sliding scale  Essential hypertension -Continue home medications  Recent COVID-19 -Tested positive for 9/22, completely recovered, no current symptoms, does not need airborne precautions  DVT prophylaxis: Lovenox Code Status: Full code Family Communication: Significant other present at bedside Disposition Plan: Home when ready Bed Type: Telemetry medical Consults called: Neurology Obs/Inp: Observation   Pamella Pert, MD, PhD [facility]  Contact via [email or web address]  07/27/2020, 3:44 PM

## 2020-07-27 NOTE — Progress Notes (Signed)
EEG Completed; Results Pending  

## 2020-07-28 ENCOUNTER — Observation Stay (HOSPITAL_COMMUNITY): Payer: Self-pay

## 2020-07-28 DIAGNOSIS — G4733 Obstructive sleep apnea (adult) (pediatric): Secondary | ICD-10-CM

## 2020-07-28 DIAGNOSIS — K219 Gastro-esophageal reflux disease without esophagitis: Secondary | ICD-10-CM

## 2020-07-28 DIAGNOSIS — R569 Unspecified convulsions: Secondary | ICD-10-CM

## 2020-07-28 LAB — COMPREHENSIVE METABOLIC PANEL
ALT: 91 U/L — ABNORMAL HIGH (ref 0–44)
AST: 47 U/L — ABNORMAL HIGH (ref 15–41)
Albumin: 2.9 g/dL — ABNORMAL LOW (ref 3.5–5.0)
Alkaline Phosphatase: 75 U/L (ref 38–126)
Anion gap: 13 (ref 5–15)
BUN: 10 mg/dL (ref 6–20)
CO2: 21 mmol/L — ABNORMAL LOW (ref 22–32)
Calcium: 9 mg/dL (ref 8.9–10.3)
Chloride: 108 mmol/L (ref 98–111)
Creatinine, Ser: 0.92 mg/dL (ref 0.61–1.24)
GFR, Estimated: >60 mL/min (ref >60)
Glucose, Bld: 86 mg/dL (ref 70–99)
Potassium: 3.8 mmol/L (ref 3.5–5.1)
Sodium: 142 mmol/L (ref 135–145)
Total Bilirubin: 1.6 mg/dL — ABNORMAL HIGH (ref 0.3–1.2)
Total Protein: 6.7 g/dL (ref 6.5–8.1)

## 2020-07-28 LAB — GLUCOSE, CAPILLARY
Glucose-Capillary: 101 mg/dL — ABNORMAL HIGH (ref 70–99)
Glucose-Capillary: 90 mg/dL (ref 70–99)
Glucose-Capillary: 89 mg/dL (ref 70–99)
Glucose-Capillary: 144 mg/dL — ABNORMAL HIGH (ref 70–99)

## 2020-07-28 LAB — MAGNESIUM: Magnesium: 1.7 mg/dL (ref 1.7–2.4)

## 2020-07-28 LAB — CBC
HCT: 40.1 % (ref 39.0–52.0)
Hemoglobin: 13.5 g/dL (ref 13.0–17.0)
MCH: 30.7 pg (ref 26.0–34.0)
MCHC: 33.7 g/dL (ref 30.0–36.0)
MCV: 91.1 fL (ref 80.0–100.0)
Platelets: 491 10*3/uL — ABNORMAL HIGH (ref 150–400)
RBC: 4.4 MIL/uL (ref 4.22–5.81)
RDW: 12.8 % (ref 11.5–15.5)
WBC: 6.6 10*3/uL (ref 4.0–10.5)
nRBC: 0 % (ref 0.0–0.2)

## 2020-07-28 LAB — HIV ANTIBODY (ROUTINE TESTING W REFLEX): HIV Screen 4th Generation wRfx: NONREACTIVE

## 2020-07-28 LAB — PHOSPHORUS: Phosphorus: 3.8 mg/dL (ref 2.5–4.6)

## 2020-07-28 MED ORDER — GADOBUTROL 1 MMOL/ML IV SOLN
8.0000 mL | Freq: Once | INTRAVENOUS | Status: AC | PRN
  Administered 2020-07-28: 8 mL via INTRAVENOUS

## 2020-07-28 MED ORDER — LORAZEPAM 2 MG/ML IJ SOLN: 1.0000 mg | INTRAMUSCULAR | Status: AC

## 2020-07-28 MED ORDER — LEVETIRACETAM 750 MG PO TABS
750.0000 mg | ORAL_TABLET | Freq: Two times a day (BID) | ORAL | Status: DC
  Administered 2020-07-28 – 2020-07-29 (×3): 750 mg via ORAL
  Filled 2020-07-28 (×3): qty 1

## 2020-07-28 MED ORDER — LORAZEPAM 2 MG/ML IJ SOLN: 2.0000 mg | INTRAMUSCULAR | Status: DC | PRN

## 2020-07-28 MED ORDER — LIDOCAINE HCL URETHRAL/MUCOSAL 2 % EX GEL
1.0000 "application " | CUTANEOUS | Status: DC | PRN
  Administered 2020-07-28: 1 via TOPICAL
  Filled 2020-07-28 (×2): qty 11

## 2020-07-28 NOTE — Progress Notes (Signed)
Subjective: Patient had another seizure overnight.  Patient's significant other on phone states she was at bedside when this happened.  She states patient was talking to her daughter on phone and suddenly was unable to speak followed by head turning (unclear with side) and eventually generalized tonic-clonic seizure.  Patient this time bit his tongue on the left side.  Patient does not remember the episode.  Denies any headache, vision changes, weakness at this point.  ROS: negative except above  Examination  Vital signs in last 24 hours: Temp:  [98.1 F (36.7 C)-100.9 F (38.3 C)] 98.4 F (36.9 C) (10/08 0813) Pulse Rate:  [62-140] 62 (10/08 0813) Resp:  [16-35] 16 (10/08 0813) BP: (101-159)/(56-95) 128/85 (10/08 0813) SpO2:  [94 %-100 %] 100 % (10/08 0813)  General: lying in bed, not in apparent distress CVS: pulse-normal rate and rhythm RS: breathing comfortably, CTA B Extremities: normal, warm Neuro: AOx3, cranial nerves II to XII grossly intact, 5/5 in all 4 extremities  Basic Metabolic Panel: Recent Labs  Lab 07/27/20 0906 07/27/20 1055  NA 139  --   K 3.6  --   CL 104  --   CO2 22  --   GLUCOSE 242*  --   BUN 9  --   CREATININE 0.81  --   CALCIUM 9.2  --   MG 1.6* 1.7    CBC: Recent Labs  Lab 07/27/20 0906 07/28/20 0526  WBC 7.3 6.6  NEUTROABS 5.9  --   HGB 13.8 13.5  HCT 40.9 40.1  MCV 89.9 91.1  PLT 548* 491*     Coagulation Studies: No results for input(s): LABPROT, INR in the last 72 hours.  Imaging MRI brain with and without contrast 07/28/2020: Negative MRI brain without any findings or visible seizure focus.   ASSESSMENT AND PLAN: 78 old male with recent diagnosis of Covid pneumonia (9/22) who presented after a seizure and had another seizure while inpatient.   New onset seizures -Patient has had 2 seizures within 24 hours.  However it appears that seizures were focal in onset (had speech disturbance at onset) and had an abnormal EEG with  sharp transients in the left.  Therefore patient is at high risk of seizure recurrence. -Possible etiology of seizures is Covid related -Patient is afebrile with normal neurologic exam therefore low suspicion for meningitis/encephalitis.  Recommendations -We will start Keppra 750 mg twice daily -Discussed performing lumbar puncture as patient has had 2 seizures which patient declined at this time.  However, if patient has another seizure, we would like to proceed with lumbar puncture to look for any infectious etiology.  We will hold off Lovenox today in case patient needs lumbar puncture -Seizure precautions -As needed IV Ativan 2 mg for seizure lasting more than 2 minutes   I have spent a total of  35  minutes with the patient reviewing hospital notes,  test results, labs and examining the patient as well as establishing an assessment and plan that was discussed personally with the patient, significant other on phone and Dr. Marland Mcalpine.  > 50% of time was spent in direct patient care.     Lindie Spruce Epilepsy [facility] For questions after 5pm please refer to AMION to reach the Neurologist on call

## 2020-07-28 NOTE — Progress Notes (Signed)
LTM started; pt COVID positive so used tape; tested event button. Advised nurse of need for bedside commode.

## 2020-07-28 NOTE — TOC Initial Note (Signed)
Transition of Care [facility]) - Initial/Assessment Note    Patient Details  Name: Isaiah Spencer MRN: [id number] Date of Birth: 09-19-1966  Transition of [facility]) CM/SW Contact:    Kermit Balo, RN Phone Number: 07/28/2020, 1:42 PM  Clinical Narrative:                 Pt is from home with spouse. Pt denies any issues with home medications or transportation. Pt has a PCP and has been affording his medications ok. Pt states he will be getting his insurance restarted.  Pt has transport home when medically ready.  Expected Discharge Plan: Home/Self Care Barriers to Discharge: Inadequate or no insurance, Continued Medical Work up, Barriers Unresolved (comment)   Patient Goals and CMS Choice        Expected Discharge Plan and Services Expected Discharge Plan: Home/Self Care       Living arrangements for the past 2 months: Single Family Home                                      Prior Living Arrangements/Services Living arrangements for the past 2 months: Single Family Home Lives with:: Spouse Patient language and need for interpreter reviewed:: Yes Do you feel safe going back to the place where you live?: Yes      Need for Family Participation in Patient Care: No (Comment) Care giver support system in place?: Yes (comment)   Criminal Activity/Legal Involvement Pertinent to Current Situation/Hospitalization: No - Comment as needed  Activities of Daily Living Home Assistive Devices/Equipment: None ADL Screening (condition at time of admission) Patient's cognitive ability adequate to safely complete daily activities?: Yes Is the patient deaf or have difficulty hearing?: No Does the patient have difficulty seeing, even when wearing glasses/contacts?: No Does the patient have difficulty concentrating, remembering, or making decisions?: Yes Patient able to express need for assistance with ADLs?: Yes Does the patient have difficulty dressing or bathing?: No Independently  performs ADLs?: Yes (appropriate for developmental age) Does the patient have difficulty walking or climbing stairs?: No Weakness of Legs: None Weakness of Arms/Hands: None  Permission Sought/Granted                  Emotional Assessment Appearance:: Appears stated age Attitude/Demeanor/Rapport: Engaged Affect (typically observed): Accepting Orientation: : Oriented to Self, Oriented to Place, Oriented to  Time, Oriented to Situation   Psych Involvement: No (comment)  Admission diagnosis:  Seizure (HCC) [R56.9] Seizure-like activity (HCC) [R56.9] Patient Active Problem List   Diagnosis Date Noted  . Seizure (HCC) 07/28/2020  . Dyslipidemia 11/20/2018  . Chest discomfort 01/21/2014  . OSA (obstructive sleep apnea) 11/02/2012  . Diabetes mellitus, type 2 (HCC) 07/30/2012  . Dysphagia, unspecified(787.20) 12/16/2011  . GERD (gastroesophageal reflux disease) 12/07/2011  . H. pylori infection 12/07/2011  . Internal hemorrhoids 07/10/2011   PCP:  Lenord Fellers, PA-C Pharmacy:   [facility] [phone] [address] - [place], [place] - [address] [place] [address] Phone: [phone] Fax: [phone]  [facility] - [place], [place] - [address] [place] [address] Phone: [phone] Fax: [phone]  [id number] - [place], [place] - [address] [place] [address] Phone: [phone] Fax: [phone]     Social Determinants of Health (SDOH) Interventions    Readmission  Risk Interventions No flowsheet data found.

## 2020-07-28 NOTE — Progress Notes (Signed)
PROGRESS NOTE    Isaiah Spencer  ZHY:[id number] DOB: 08-16-1966 DOA: 07/27/2020 PCP: Lenord Fellers, PA-C   Brief Narrative:  HPI per Dr. Pamella Pert on 07/27/20 Isaiah Spencer is a 54 y.o. male with medical history significant of DM2, OSA, HTN, HLD, recent Covid on 9/22 s/p mAb not recovered, comes to the hospital with a seizure. Patient denies any prior history of seizures.  Girlfriend is at bedside and states that patient has been more forgetful in the past couple of days, very anxious, very fearful, trying to stay awake at night because he was afraid to fall asleep.  He has been under a lot of stress recently, had a family member die of Covid and he himself had Covid and it appears that he was very scared.  Today he had what looks like a generalized tonic-clonic seizure based on descriptions, lasted approximately 5 minutes and he bit his right side of the tongue and he was also found to be foaming at the mouth.  He remains very anxious in the ED constantly watching the monitor and worrying about his blood pressure. He denies heavy alcohol intake and last time he drank was at the beach about couple weeks ago.  No drugs. 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  Denies any headaches  ED Course: In the ED he is afebrile, normotensive and satting well on room air. Labs are relatively unremarkable, lactic acid was mildly elevated but improved with fluids. Glucose 242. Mg 1.6. UA unremarkable, UDS negative. CT brain negative. Neurology was consulted and we are asked to admit.   **Interim History Multiple seizures and had one in the ED yesterday.  Neurology evaluated and started him on antiepileptics and recommending LTM EEG monitoring.  Neurology has also recommended an LP given his 2 seizures but he has refused.  If he has another seizure they did  strongly urge LP and have held his Lovenox for now.  Repeat CMP this morning still pending  Assessment & Plan:   Principal Problem:   Seizure (HCC) Active Problems:   Diabetes mellitus, type 2 (HCC)   OSA (obstructive sleep apnea)  New onset Seizures likely secondary to COVID-19 disease -Head CT done and showed "No acute intracranial abnormality." -Neurology consulted, appreciate input.   -Patient underwent an EEG in the ED which showed some sharp transients in the left frontocentral region which can mean potential for epileptiform discharges versus artifacts.  Patient's hands were shaking at that time.   -Neurology recommends overnight LTM EEG and have started the patient on Keppra  -MRI of the brain done and showed "Negative brain MRI.  No acute finding or visible seizure focus." -Started on Levitiracetam 750 mg po BID by Neurology -Seizure Precautions -On lactated Ringer's at 125 MLS per hour but will reduce to 75 MLS per hour for 12 more hours -Neurology recommended LP but patient has refused this.  If he has no seizure then definitely recommending LP at this time.  Type 2 Diabetes Mellitus -Hold oral agents -HbA1c was 6.9 -Placed on Sensitive Novolog SSI AC -CBG's ranging from 100-101  GERD -C/w Pantoprazole 40 mg po Daily  OSA -Does not use a CPAP  Essential Hypertension -Continue home medications with Lisinopril 2.5 mg po Daily   Recent COVID-19 -Tested positive for 9/22 and SARS-CoV-2 Positive here,  -  Completely recovered, no current symptoms, does not need airborne precautions  Thrombocytosis -Likely Reactive -Patient's Platelet Count went from 548 -> 491 -Continue to Monitor and Trend -Repeat CBC in the AM  Hyperbilirubinemia -Mild at 1.3 -Repeat this AM pending -Continue to Monitor and Trend -Repeat CMP in AM   DVT prophylaxis: SCDs; Lovenox held for possible LP Code Status: FULL CODE Family Communication: No family present at bedside  Disposition  Plan: Pending further evaluation and clearance by neurology  Status is: Observation  The patient will require care spanning > 2 midnights and should be moved to inpatient because: Ongoing diagnostic testing needed not appropriate for outpatient work up and Inpatient level of care appropriate due to severity of illness  Dispo: The patient is from: Home              Anticipated d/c is to: Home              Anticipated d/c date is: 1 day              Patient currently is not medically stable to d/c.  Consultants:   Neurology   Procedures:  EEG This study is within normal limits. No seizures or definite epileptiform discharges were seen throughout the recording.  Sharp transients were seen in left frontocentral region which seem to be artifactual in nature.  Recommend repeat EEG if concern for ictal-interictal activity persists  LTM EEG  Antimicrobials:  Anti-infectives (From admission, onward)   None       Subjective: Seen and examined at bedside and he was doing okay today.  Denies any chest pain, lightheadedness or dizziness.  Had multiple seizures.  Recently had Covid but is asymptomatic.  No other concerns or complaints at this time.  Objective: Vitals:   07/27/20 1957 07/27/20 2325 07/28/20 0350 07/28/20 0813  BP: 113/61 106/85 116/74 128/85  Pulse: 87 79 69 62  Resp: 19 19 18 16   Temp: (!) 100.9 F (38.3 C) 99.2 F (37.3 C) 98.1 F (36.7 C) 98.4 F (36.9 C)  TempSrc: Oral Oral Oral Oral  SpO2: 98% 97% 98% 100%  Weight:      Height:        Intake/Output Summary (Last 24 hours) at 07/28/2020 1124 Last data filed at 07/28/2020 0537 Gross per 24 hour  Intake 600 ml  Output 400 ml  Net 200 ml   Filed Weights   07/27/20 0752  Weight: 85.7 kg   Examination: Physical Exam:  Constitutional: WN/WD AA male in NAD and appears calm today Eyes: Lids and conjunctivae normal, sclerae anicteric  ENMT: External Ears, Nose appear normal. Grossly normal hearing.  Neck:  Appears normal, supple, no cervical masses, normal ROM, no appreciable thyromegaly; no JVD Respiratory: Diminished to auscultation bilaterally, no wheezing, rales, rhonchi or crackles. Normal respiratory effort and patient is not tachypenic. No accessory muscle use. Unlabored breathing Cardiovascular: RRR, no murmurs / rubs / gallops. S1 and S2 auscultated. No extremity edema. Abdomen: Soft, non-tender, non-distended. Bowel sounds positive.  GU: Deferred. Musculoskeletal: No clubbing / cyanosis of digits/nails. No joint deformity upper and lower extremities. Skin: No rashes, lesions, ulcers on a limited skin evaluation. No induration; Warm and dry.  Neurologic: CN 2-12 grossly intact with no focal deficits. Romberg sign and cerebellar reflexes not assessed.  Psychiatric: Normal judgment and insight. Alert and oriented x 3. Normal mood and appropriate affect.   Data Reviewed: I have personally reviewed following labs and imaging studies  CBC: Recent Labs  Lab  07/27/20 0906 07/28/20 0526  WBC 7.3 6.6  NEUTROABS 5.9  --   HGB 13.8 13.5  HCT 40.9 40.1  MCV 89.9 91.1  PLT 548* 491*   Basic Metabolic Panel: Recent Labs  Lab 07/27/20 0906 07/27/20 1055  NA 139  --   K 3.6  --   CL 104  --   CO2 22  --   GLUCOSE 242*  --   BUN 9  --   CREATININE 0.81  --   CALCIUM 9.2  --   MG 1.6* 1.7   GFR: Estimated Creatinine Clearance: 117.8 mL/min (by C-G formula based on SCr of 0.81 mg/dL). Liver Function Tests: Recent Labs  Lab 07/27/20 0906  AST 36  ALT 109*  ALKPHOS 90  BILITOT 1.3*  PROT 7.1  ALBUMIN 3.1*   No results for input(s): LIPASE, AMYLASE in the last 168 hours. No results for input(s): AMMONIA in the last 168 hours. Coagulation Profile: No results for input(s): INR, PROTIME in the last 168 hours. Cardiac Enzymes: No results for input(s): CKTOTAL, CKMB, CKMBINDEX, TROPONINI in the last 168 hours. BNP (last 3 results) No results for input(s): PROBNP in the last  8760 hours. HbA1C: Recent Labs    07/27/20 1635  HGBA1C 6.9*   CBG: Recent Labs  Lab 07/27/20 1618 07/27/20 1651 07/27/20 2207 07/28/20 0626  GLUCAP 133* 146* 100* 101*   Lipid Profile: No results for input(s): CHOL, HDL, LDLCALC, TRIG, CHOLHDL, LDLDIRECT in the last 72 hours. Thyroid Function Tests: No results for input(s): TSH, T4TOTAL, FREET4, T3FREE, THYROIDAB in the last 72 hours. Anemia Panel: No results for input(s): VITAMINB12, FOLATE, FERRITIN, TIBC, IRON, RETICCTPCT in the last 72 hours. Sepsis Labs: Recent Labs  Lab 07/27/20 0906 07/27/20 1044  LATICACIDVEN 2.4* 1.5    Recent Results (from the past 240 hour(s))  Respiratory Panel by RT PCR (Flu A&B, Covid) - Nasopharyngeal Swab     Status: Abnormal   Collection Time: 07/27/20  5:39 PM   Specimen: Nasopharyngeal Swab  Result Value Ref Range Status   SARS Coronavirus 2 by RT PCR POSITIVE (A) NEGATIVE Final    Comment: RESULT CALLED TO, READ BACK BY AND VERIFIED WITHHolley Bouche RN 1918 07/27/20 A BROWNING (NOTE) SARS-CoV-2 target nucleic acids are DETECTED.  SARS-CoV-2 RNA is generally detectable in upper respiratory specimens  during the acute phase of infection. Positive results are indicative of the presence of the identified virus, but do not rule out bacterial infection or co-infection with other pathogens not detected by the test. Clinical correlation with patient history and other diagnostic information is necessary to determine patient infection status. The expected result is Negative.  Fact Sheet for Patients:  https://www.moore.com/  Fact Sheet for Healthcare Providers: https://www.young.biz/  This test is not yet approved or cleared by the [place] FDA and  has been authorized for detection and/or diagnosis of SARS-CoV-2 by FDA under an Emergency Use Authorization (EUA).  This EUA will remain in effect (meaning this test ca n be used) for the  duration of  the COVID-19 declaration under Section 564(b)(1) of the Act, 21 U.S.C. section 360bbb-3(b)(1), unless the authorization is terminated or revoked sooner.      Influenza A by PCR NEGATIVE NEGATIVE Final   Influenza B by PCR NEGATIVE NEGATIVE Final    Comment: (NOTE) The Xpert Xpress SARS-CoV-2/FLU/RSV assay is intended as an aid in  the diagnosis of influenza from Nasopharyngeal swab specimens and  should not be used as a sole basis  for treatment. Nasal washings and  aspirates are unacceptable for Xpert Xpress SARS-CoV-2/FLU/RSV  testing.  Fact Sheet for Patients: https://www.moore.com/  Fact Sheet for Healthcare Providers: https://www.young.biz/  This test is not yet approved or cleared by the [place] FDA and  has been authorized for detection and/or diagnosis of SARS-CoV-2 by  FDA under an Emergency Use Authorization (EUA). This EUA will remain  in effect (meaning this test can be used) for the duration of the  Covid-19 declaration under Section 564(b)(1) of the Act, 21  U.S.C. section 360bbb-3(b)(1), unless the authorization is  terminated or revoked. Performed at [facility], [address] [place], [place] [address]     RN Pressure Injury Documentation:     Estimated body mass index is 24.94 kg/m as calculated from the following:   Height as of this encounter: 6\' 1"  (1.854 m).   Weight as of this encounter: 85.7 kg.  Malnutrition Type:      Malnutrition Characteristics:      Nutrition Interventions:    Radiology Studies: EEG  Result Date: 07/27/2020 09/26/2020, MD     07/27/2020  2:53 PM Patient Name: Isaiah D 09/26/2020 MRN: Spencer Epilepsy Attending: [id number] Referring Physician/Provider: Dr. Charlsie Quest Date: 07/27/2020 Duration: 26.31 mins Patient history: 55 year old male with new onset seizures.  EEG to evaluate for seizures. Level of alertness: Awake, asleep AEDs during EEG  study: None Technical aspects: This EEG study was done with scalp electrodes positioned according to the 10-20 International system of electrode placement. Electrical activity was acquired at a sampling rate of 500Hz  and reviewed with a high frequency filter of 70Hz  and a low frequency filter of 1Hz . EEG data were recorded continuously and digitally stored. Description: The posterior dominant rhythm consists of 10 Hz activity of moderate voltage (25-35 uV) seen predominantly in posterior head regions, symmetric and reactive to eye opening and eye closing. Sleep was characterized by vertex waves, sleep spindles (12 to 14 Hz), maximal frontocentral region.  Sharp transients were seen in left frontocentral region at times periodic at 1 Hz without definite evolution predominantly during sleep and improved once patient woke up. The morphology and distribution as well as presence in sleep with normal awake EEG make it suspicious for them being artifactual in nature.  Physiology photic driving was seen during photic stimulation.  Hyperventilation was not performed.   IMPRESSION: This study is within normal limits. No seizures or definite epileptiform discharges were seen throughout the recording.  Sharp transients were seen in left frontocentral region which seem to be artifactual in nature.  Recommend repeat EEG if concern for ictal-interictal activity persists. 44   CT Head Wo Contrast  Result Date: 07/27/2020 CLINICAL DATA:  Seizure EXAM: CT HEAD WITHOUT CONTRAST TECHNIQUE: Contiguous axial images were obtained from the base of the skull through the vertex without intravenous contrast. COMPARISON:  2015 FINDINGS: Brain: There is no acute intracranial hemorrhage, mass effect, or edema. Gray-white differentiation is preserved. There is no extra-axial fluid collection. Ventricles and sulci are within normal limits in size and configuration. Vascular: No hyperdense vessel or unexpected calcification.  Skull: Calvarium is unremarkable. Sinuses/Orbits: No acute finding. Other: None. IMPRESSION: No acute intracranial abnormality. Electronically Signed   By: M.D.   On: 07/27/2020 10:40   MR Brain W and Wo Contrast  Result Date: 07/28/2020 CLINICAL DATA:  Nontraumatic seizure EXAM: MRI HEAD WITHOUT AND WITH CONTRAST TECHNIQUE: Multiplanar, multiecho pulse sequences of the brain and  surrounding structures were obtained without and with intravenous contrast. CONTRAST:  61mL GADAVIST GADOBUTROL 1 MMOL/ML IV SOLN COMPARISON:  Head CT from yesterday FINDINGS: Brain: No acute infarction, hemorrhage, hydrocephalus, extra-axial collection or mass lesion. Symmetric unremarkable hippocampus appearance. Vascular: Normal flow voids and vascular enhancement Skull and upper cervical spine: Normal marrow signal Sinuses/Orbits: Mild generalized mucosal thickening in the ethmoid sinuses with small retention cysts along the maxillary sinus floors. Other: Artifact from earrings, which were not able to be removed. IMPRESSION: Negative brain MRI.  No acute finding or visible seizure focus. Electronically Signed   By: Marnee Spring M.D.   On: 07/28/2020 08:06   DG Chest Port 1 View  Result Date: 07/27/2020 CLINICAL DATA:  Seizure.  Recent COVID-19 positive EXAM: PORTABLE CHEST 1 VIEW COMPARISON:  July 19, 2020 FINDINGS: There is subtle ill-defined opacity in portions of the left mid and lower lung regions without consolidation. Heart size and pulmonary vascularity are normal. No adenopathy. No bone lesions. IMPRESSION: Ill-defined opacity in mid and lower lung zones again noted, likely due to atypical organism pneumonia. No consolidation. Lungs otherwise clear. Heart size normal. No adenopathy. Electronically Signed   By: Bretta Bang III M.D.   On: 07/27/2020 09:18   Scheduled Meds: . aspirin  325 mg Oral Daily  . insulin aspart  0-9 Units Subcutaneous TID WC  . levETIRAcetam  750 mg Oral BID  .  lisinopril  2.5 mg Oral Daily  . multivitamin with minerals  1 tablet Oral Daily  . pantoprazole  40 mg Oral Daily  . [START ON 08/02/2020] Vitamin D (Ergocalciferol)  50,000 Units Oral Weekly   Continuous Infusions: . lactated ringers 125 mL/hr at 07/28/20 0824    LOS: 0 days   Merlene Laughter, DO [facility] PAGER is on AMION  If 7PM-7AM, please contact night-coverage [email or web address]

## 2020-07-28 NOTE — Progress Notes (Signed)
Pt became confused, pulling IV out and attempting to remove leads from head. Pt stated he thinks the EEG is going to be tomorrow and wonders why he has electrodes on his head. Pt with high anxiety level. Wanting to go home and see his girlfiriend. He states he doesn't understand why she can't come here. He doesn't want to stay here without her. Rationale provided for above questions, pt still anxious and stating he will not stay. Starting to remove telemetry, and refusing IV restart. MD was notified of above confusion and change in mental status. Girlfriend did come to bring pt's teeth. Patient saw her for a few minutes. Pt more calm now. He agreed to have RN place IV access and verbalized his reason why he is here as well as orientation questions. Continue to monitor.

## 2020-07-29 LAB — COMPREHENSIVE METABOLIC PANEL
ALT: 74 U/L — ABNORMAL HIGH (ref 0–44)
AST: 38 U/L (ref 15–41)
Albumin: 2.7 g/dL — ABNORMAL LOW (ref 3.5–5.0)
Alkaline Phosphatase: 73 U/L (ref 38–126)
Anion gap: 10 (ref 5–15)
BUN: 10 mg/dL (ref 6–20)
CO2: 24 mmol/L (ref 22–32)
Calcium: 8.8 mg/dL — ABNORMAL LOW (ref 8.9–10.3)
Chloride: 107 mmol/L (ref 98–111)
Creatinine, Ser: 0.83 mg/dL (ref 0.61–1.24)
GFR, Estimated: >60 mL/min (ref >60)
Glucose, Bld: 116 mg/dL — ABNORMAL HIGH (ref 70–99)
Potassium: 3.7 mmol/L (ref 3.5–5.1)
Sodium: 141 mmol/L (ref 135–145)
Total Bilirubin: 1.4 mg/dL — ABNORMAL HIGH (ref 0.3–1.2)
Total Protein: 6.3 g/dL — ABNORMAL LOW (ref 6.5–8.1)

## 2020-07-29 LAB — CBC WITH DIFFERENTIAL/PLATELET
Abs Immature Granulocytes: 0.02 10*3/uL (ref 0.00–0.07)
Basophils Absolute: 0 10*3/uL (ref 0.0–0.1)
Basophils Relative: 0 %
Eosinophils Absolute: 0 10*3/uL (ref 0.0–0.5)
Eosinophils Relative: 0 %
HCT: 39.1 % (ref 39.0–52.0)
Hemoglobin: 13.4 g/dL (ref 13.0–17.0)
Immature Granulocytes: 0 %
Lymphocytes Relative: 32 %
Lymphs Abs: 1.6 10*3/uL (ref 0.7–4.0)
MCH: 31.3 pg (ref 26.0–34.0)
MCHC: 34.3 g/dL (ref 30.0–36.0)
MCV: 91.4 fL (ref 80.0–100.0)
Monocytes Absolute: 0.8 10*3/uL (ref 0.1–1.0)
Monocytes Relative: 16 %
Neutro Abs: 2.5 10*3/uL (ref 1.7–7.7)
Neutrophils Relative %: 52 %
Platelets: 499 10*3/uL — ABNORMAL HIGH (ref 150–400)
RBC: 4.28 MIL/uL (ref 4.22–5.81)
RDW: 12.3 % (ref 11.5–15.5)
WBC: 4.9 10*3/uL (ref 4.0–10.5)
nRBC: 0 % (ref 0.0–0.2)

## 2020-07-29 LAB — GLUCOSE, CAPILLARY
Glucose-Capillary: 97 mg/dL (ref 70–99)
Glucose-Capillary: 129 mg/dL — ABNORMAL HIGH (ref 70–99)

## 2020-07-29 LAB — PHOSPHORUS: Phosphorus: 4 mg/dL (ref 2.5–4.6)

## 2020-07-29 LAB — MAGNESIUM: Magnesium: 1.6 mg/dL — ABNORMAL LOW (ref 1.7–2.4)

## 2020-07-29 MED ORDER — ONDANSETRON HCL 4 MG PO TABS: 4.0000 mg | ORAL_TABLET | Freq: Four times a day (QID) | ORAL | 0 refills | Status: AC | PRN

## 2020-07-29 MED ORDER — HYDROXYZINE HCL 10 MG PO TABS: 10.0000 mg | ORAL_TABLET | Freq: Four times a day (QID) | ORAL | 0 refills | Status: AC | PRN

## 2020-07-29 MED ORDER — MAGNESIUM OXIDE 400 (241.3 MG) MG PO TABS
800.0000 mg | ORAL_TABLET | Freq: Once | ORAL | Status: AC
  Administered 2020-07-29: 800 mg via ORAL
  Filled 2020-07-29: qty 2

## 2020-07-29 MED ORDER — LEVETIRACETAM 750 MG PO TABS: 750.0000 mg | ORAL_TABLET | Freq: Two times a day (BID) | ORAL | 0 refills | Status: DC

## 2020-07-29 MED ORDER — MAGNESIUM SULFATE 2 GM/50ML IV SOLN
2.0000 g | Freq: Once | INTRAVENOUS | Status: DC
  Filled 2020-07-29: qty 50

## 2020-07-29 NOTE — Procedures (Addendum)
Patient Name: Isaiah Spencer  MRN: [id number]  Epilepsy Attending: Charlsie Quest  Referring Physician/Provider: Dr. Caryl Pina Duration: 07/28/2020 1130 to 07/28/2020 1227  Patient history: 54 year old male with new onset seizures.  EEG to evaluate for seizures.  Level of alertness: Awake, asleep  AEDs during EEG study: None  Technical aspects: This EEG study was done with scalp electrodes positioned according to the 10-20 International system of electrode placement. Electrical activity was acquired at a sampling rate of 500Hz  and reviewed with a high frequency filter of 70Hz  and a low frequency filter of 1Hz . EEG data were recorded continuously and digitally stored.   Description: The posterior dominant rhythm consists of 10 Hz activity of moderate voltage (25-35 uV) seen predominantly in posterior head regions, symmetric and reactive to eye opening and eye closing. Sleep was characterized by vertex waves, sleep spindles (12 to 14 Hz), maximal frontocentral region. Photic stimulation and hyperventilation was not performed.     IMPRESSION: This study is within normal limits. No seizures orepileptiform discharges were seen throughout the recording.    Ersa Delaney 

## 2020-07-29 NOTE — Discharge Instructions (Signed)

## 2020-07-29 NOTE — Progress Notes (Addendum)
Subjective: Pt feels back to baseline and wants to go home. No further seizures. EEG wnl.   Objective: Current vital signs: BP (!) 141/89 (BP Location: Left Arm)   Pulse (!) 52   Temp 97.7 F (36.5 C) (Oral)   Resp 16   Ht  (1.854 m)   Wt 85.7 kg   SpO2 100%   BMI 24.94 kg/m  Vital signs in last 24 hours: Temp:  [97.7 F (36.5 C)-98.6 F (37 C)] 97.7 F (36.5 C) (10/09 0620) Pulse Rate:  [52-70] 52 (10/09 0620) Resp:  [16-20] 16 (10/09 0620) BP: (119-143)/(78-89) 141/89 (10/09 0620) SpO2:  [97 %-100 %] 100 % (10/09 0620)  Intake/Output from previous day: 10/08 0701 - 10/09 0700 In: 1443.3 [I.V.:1443.3] Out: 750 [Urine:750] Intake/Output this shift: No intake/output data recorded. Nutritional status:  Diet Order            Diet Carb Modified Fluid consistency: Thin; Room service appropriate? Yes  Diet effective now                 Neurologic Exam: A&Ox4, speech clear, fluent, comprehension and memory intact PERRL, EOMI, Face symm 5/5 throughout, no focal deficit Intact to LT throughout No ataxia Gait: Deferred  Lab Results: Results for orders placed or performed during the hospital encounter of 07/27/20 (from the past 48 hour(s))  Urine rapid drug screen (hosp performed)     Status: None   Collection Time: 07/27/20  8:46 AM  Result Value Ref Range   Opiates NONE DETECTED NONE DETECTED   Cocaine NONE DETECTED NONE DETECTED   Benzodiazepines NONE DETECTED NONE DETECTED   Amphetamines NONE DETECTED NONE DETECTED   Tetrahydrocannabinol NONE DETECTED NONE DETECTED   Barbiturates NONE DETECTED NONE DETECTED    Comment: (NOTE) DRUG SCREEN FOR MEDICAL PURPOSES ONLY.  IF CONFIRMATION IS NEEDED FOR ANY PURPOSE, NOTIFY LAB WITHIN 5 DAYS.  LOWEST DETECTABLE LIMITS FOR URINE DRUG SCREEN Drug Class                     Cutoff (ng/mL) Amphetamine and metabolites    1000 Barbiturate and metabolites    200 Benzodiazepine                 200 Tricyclics and  metabolites     300 Opiates and metabolites        300 Cocaine and metabolites        300 THC                            50 Performed at [facility] Lab, [address], [place], [place] [address]   Comprehensive metabolic panel     Status: Abnormal   Collection Time: 07/27/20  9:06 AM  Result Value Ref Range   Sodium 139 135 - 145 mmol/L   Potassium 3.6 3.5 - 5.1 mmol/L   Chloride 104 98 - 111 mmol/L   CO2 22 22 - 32 mmol/L   Glucose, Bld 242 (H) 70 - 99 mg/dL    Comment: Glucose reference range applies only to samples taken after fasting for at least 8 hours.   BUN 9 6 - 20 mg/dL   Creatinine, Ser 8.11 0.61 - 1.24 mg/dL   Calcium 9.2 8.9 - 91.4 mg/dL   Total Protein 7.1 6.5 - 8.1 g/dL   Albumin 3.1 (L) 3.5 - 5.0 g/dL   AST 36 15 - 41 U/L   ALT  109 (H) 0 - 44 U/L   Alkaline Phosphatase 90 38 - 126 U/L   Total Bilirubin 1.3 (H) 0.3 - 1.2 mg/dL   GFR calc non Af Amer >60 >60 mL/min   Anion gap 13 5 - 15    Comment: Performed at [facility] Lab, [address], [place], [place] [address]  CBC with Differential     Status: Abnormal   Collection Time: 07/27/20  9:06 AM  Result Value Ref Range   WBC 7.3 4.0 - 10.5 K/uL   RBC 4.55 4.22 - 5.81 MIL/uL   Hemoglobin 13.8 13.0 - 17.0 g/dL   HCT 01.4 39 - 52 %   MCV 89.9 80.0 - 100.0 fL   MCH 30.3 26.0 - 34.0 pg   MCHC 33.7 30.0 - 36.0 g/dL   RDW 10.3 01.3 - 14.3 %   Platelets 548 (H) 150 - 400 K/uL   nRBC 0.0 0.0 - 0.2 %   Neutrophils Relative % 83 %   Neutro Abs 5.9 1.7 - 7.7 K/uL   Lymphocytes Relative 10 %   Lymphs Abs 0.8 0.7 - 4.0 K/uL   Monocytes Relative 7 %   Monocytes Absolute 0.5 0.1 - 1.0 K/uL   Eosinophils Relative 0 %   Eosinophils Absolute 0.0 0 - 0 K/uL   Basophils Relative 0 %   Basophils Absolute 0.0 0 - 0 K/uL   Immature Granulocytes 0 %   Abs Immature Granulocytes 0.03 0.00 - 0.07 K/uL    Comment: Performed at [facility] Lab, [address] [place], [place] [address]  Lactic acid, plasma      Status: Abnormal   Collection Time: 07/27/20  9:06 AM  Result Value Ref Range   Lactic Acid, Venous 2.4 (HH) 0.5 - 1.9 mmol/L    Comment: CRITICAL RESULT CALLED TO, READ BACK BY AND VERIFIED WITH: RN J DODD AT [id number] 07/27/20 BY L BENFIELD Performed at [facility] Lab, [address], [place], [place] [address]   Magnesium     Status: Abnormal   Collection Time: 07/27/20  9:06 AM  Result Value Ref Range   Magnesium 1.6 (L) 1.7 - 2.4 mg/dL    Comment: Performed at [facility] Lab, [address], [place], [place] [address]  Urinalysis, Routine w reflex microscopic     Status: Abnormal   Collection Time: 07/27/20  9:17 AM  Result Value Ref Range   Color, Urine YELLOW YELLOW   APPearance CLEAR CLEAR   Specific Gravity, Urine 1.016 1.005 - 1.030   pH 5.0 5.0 - 8.0   Glucose, UA 50 (A) NEGATIVE mg/dL   Hgb urine dipstick NEGATIVE NEGATIVE   Bilirubin Urine NEGATIVE NEGATIVE   Ketones, ur 80 (A) NEGATIVE mg/dL   Protein, ur 379 (A) NEGATIVE mg/dL   Nitrite NEGATIVE NEGATIVE   Leukocytes,Ua NEGATIVE NEGATIVE   RBC / HPF 0-5 0 - 5 RBC/hpf   WBC, UA 0-5 0 - 5 WBC/hpf   Bacteria, UA NONE SEEN NONE SEEN   Squamous Epithelial / LPF 0-5 0 - 5   Mucus PRESENT    Granular Casts, UA PRESENT     Comment: Performed at [facility] Lab, [address], [place], [place] [address]  Lactic acid, plasma     Status: None   Collection Time: 07/27/20 10:44 AM  Result Value Ref Range   Lactic Acid, Venous 1.5 0.5 - 1.9 mmol/L    Comment: Performed at [facility] Lab, [address] [place], [place] [address]  Magnesium     Status: None   Collection Time: 07/27/20 10:55 AM  Result Value Ref Range   Magnesium 1.7 1.7 - 2.4 mg/dL    Comment: Performed at [facility] Lab, [address] [place], [place] [address]  CBG monitoring, ED     Status: Abnormal   Collection Time: 07/27/20  4:18 PM  Result Value Ref Range   Glucose-Capillary 133 (H) 70 - 99 mg/dL    Comment: Glucose reference  range applies only to samples taken after fasting for at least 8 hours.  Hemoglobin A1c     Status: Abnormal   Collection Time: 07/27/20  4:35 PM  Result Value Ref Range   Hgb A1c MFr Bld 6.9 (H) 4.8 - 5.6 %    Comment: (NOTE) Pre diabetes:          5.7%-6.4%  Diabetes:              >6.4%  Glycemic control for   <7.0% adults with diabetes    Mean Plasma Glucose 151.33 mg/dL    Comment: Performed at [facility] Lab, [address] [place], [place] [address]  CBG monitoring, ED     Status: Abnormal   Collection Time: 07/27/20  4:51 PM  Result Value Ref Range   Glucose-Capillary 146 (H) 70 - 99 mg/dL    Comment: Glucose reference range applies only to samples taken after fasting for at least 8 hours.  Respiratory Panel by RT PCR (Flu A&B, Covid) - Nasopharyngeal Swab     Status: Abnormal   Collection Time: 07/27/20  5:39 PM   Specimen: Nasopharyngeal Swab  Result Value Ref Range   SARS Coronavirus 2 by RT PCR POSITIVE (A) NEGATIVE    Comment: RESULT CALLED TO, READ BACK BY AND VERIFIED WITHHolley Bouche RN 1918 07/27/20 A BROWNING (NOTE) SARS-CoV-2 target nucleic acids are DETECTED.  SARS-CoV-2 RNA is generally detectable in upper respiratory specimens  during the acute phase of infection. Positive results are indicative of the presence of the identified virus, but do not rule out bacterial infection or co-infection with other pathogens not detected by the test. Clinical correlation with patient history and other diagnostic information is necessary to determine patient infection status. The expected result is Negative.  Fact Sheet for Patients:  https://www.moore.com/  Fact Sheet for Healthcare Providers: https://www.young.biz/  This test is not yet approved or cleared by the [place] FDA and  has been authorized for detection and/or diagnosis of SARS-CoV-2 by FDA under an Emergency Use Authorization (EUA).  This EUA  will remain in effect (meaning this test ca n be used) for the duration of  the COVID-19 declaration under Section 564(b)(1) of the Act, 21 U.S.C. section 360bbb-3(b)(1), unless the authorization is terminated or revoked sooner.      Influenza A by PCR NEGATIVE NEGATIVE   Influenza B by PCR NEGATIVE NEGATIVE    Comment: (NOTE) The Xpert Xpress SARS-CoV-2/FLU/RSV assay is intended as an aid in  the diagnosis of influenza from Nasopharyngeal swab specimens and  should not be used as a sole basis for treatment. Nasal washings and  aspirates are unacceptable for Xpert Xpress SARS-CoV-2/FLU/RSV  testing.  Fact Sheet for Patients: https://www.moore.com/  Fact Sheet for Healthcare Providers: https://www.young.biz/  This test is not yet approved or cleared by the [place] FDA and  has been authorized for detection and/or diagnosis of SARS-CoV-2 by  FDA under an Emergency Use Authorization (EUA).  This EUA will remain  in effect (meaning this test can be used) for the duration of the  Covid-19 declaration under Section 564(b)(1) of the Act, 21  U.S.C. section 360bbb-3(b)(1), unless the authorization is  terminated or revoked. Performed at [facility] Lab, [address] [place], [place] [address]   Glucose, capillary     Status: Abnormal   Collection Time: 07/27/20 10:07 PM  Result Value Ref Range   Glucose-Capillary 100 (H) 70 - 99 mg/dL    Comment: Glucose reference range applies only to samples taken after fasting for at least 8 hours.  HIV Antibody (routine testing w rflx)     Status: None   Collection Time: 07/28/20  5:17 AM  Result Value Ref Range   HIV Screen 4th Generation wRfx Non Reactive Non Reactive    Comment: Performed at [facility] Lab, [address] [place], [place] [address]  CBC     Status: Abnormal   Collection Time: 07/28/20  5:26 AM  Result Value Ref Range   WBC 6.6 4.0 - 10.5 K/uL   RBC 4.40 4.22 - 5.81  MIL/uL   Hemoglobin 13.5 13.0 - 17.0 g/dL   HCT 02.5 39 - 52 %   MCV 91.1 80.0 - 100.0 fL   MCH 30.7 26.0 - 34.0 pg   MCHC 33.7 30.0 - 36.0 g/dL   RDW 85.2 77.8 - 24.2 %   Platelets 491 (H) 150 - 400 K/uL   nRBC 0.0 0.0 - 0.2 %    Comment: Performed at [facility], [address] [place], [place] [address]  Glucose, capillary     Status: Abnormal   Collection Time: 07/28/20  6:26 AM  Result Value Ref Range   Glucose-Capillary 101 (H) 70 - 99 mg/dL    Comment: Glucose reference range applies only to samples taken after fasting for at least 8 hours.   Comment 1 Notify RN    Comment 2 Document in Chart   Comprehensive metabolic panel     Status: Abnormal   Collection Time: 07/28/20  9:25 AM  Result Value Ref Range   Sodium 142 135 - 145 mmol/L   Potassium 3.8 3.5 - 5.1 mmol/L   Chloride 108 98 - 111 mmol/L   CO2 21 (L) 22 - 32 mmol/L   Glucose, Bld 86 70 - 99 mg/dL    Comment: Glucose reference range applies only to samples taken after fasting for at least 8 hours.   BUN 10 6 - 20 mg/dL   Creatinine, Ser 4.43 0.61 - 1.24 mg/dL   Calcium 9.0 8.9 - 15.4 mg/dL   Total Protein 6.7 6.5 - 8.1 g/dL   Albumin 2.9 (L) 3.5 - 5.0 g/dL   AST 47 (H) 15 - 41 U/L   ALT 91 (H) 0 - 44 U/L   Alkaline Phosphatase 75 38 - 126 U/L   Total Bilirubin 1.6 (H) 0.3 - 1.2 mg/dL   GFR, Estimated >00 >86 mL/min   Anion gap 13 5 - 15    Comment: Performed at [facility] Lab, [address] [place], [place] [address]  Magnesium     Status: None   Collection Time: 07/28/20  9:25 AM  Result Value Ref Range   Magnesium 1.7 1.7 - 2.4 mg/dL    Comment: Performed at [facility], [address] [place], [place] [address]  Phosphorus     Status: None   Collection Time: 07/28/20  9:25 AM  Result Value Ref Range   Phosphorus 3.8 2.5 - 4.6 mg/dL    Comment: Performed at [facility] Lab, [address] [place], [place] [address]  Glucose, capillary     Status: None   Collection Time:  07/28/20 12:33 PM  Result Value Ref Range   Glucose-Capillary 90 70 - 99 mg/dL    Comment: Glucose reference range applies only to samples taken after fasting for at least 8 hours.  Glucose, capillary     Status: None   Collection Time: 07/28/20  5:42 PM  Result Value Ref Range   Glucose-Capillary 89 70 - 99 mg/dL    Comment: Glucose reference range applies only to samples taken after fasting for at least 8 hours.  Glucose, capillary     Status: Abnormal   Collection Time: 07/28/20  9:46 PM  Result Value Ref Range   Glucose-Capillary 144 (H) 70 - 99 mg/dL    Comment: Glucose reference range applies only to samples taken after fasting for at least 8 hours.  Comprehensive metabolic panel     Status: Abnormal   Collection Time: 07/29/20  2:18 AM  Result Value Ref Range   Sodium 141 135 - 145 mmol/L   Potassium 3.7 3.5 - 5.1 mmol/L   Chloride 107 98 - 111 mmol/L   CO2 24 22 - 32 mmol/L   Glucose, Bld 116 (H) 70 - 99 mg/dL    Comment: Glucose reference range applies only to samples taken after fasting for at least 8 hours.   BUN 10 6 - 20 mg/dL   Creatinine, Ser 4.40 0.61 - 1.24 mg/dL   Calcium 8.8 (L) 8.9 - 10.3 mg/dL   Total Protein 6.3 (L) 6.5 - 8.1 g/dL   Albumin 2.7 (L) 3.5 - 5.0 g/dL   AST 38 15 - 41 U/L   ALT 74 (H) 0 - 44 U/L   Alkaline Phosphatase 73 38 - 126 U/L   Total Bilirubin 1.4 (H) 0.3 - 1.2 mg/dL   GFR, Estimated >10 >27 mL/min   Anion gap 10 5 - 15    Comment: Performed at [facility] Lab, [address] [place], [place] [address]  CBC with Differential/Platelet     Status: Abnormal   Collection Time: 07/29/20  2:18 AM  Result Value Ref Range   WBC 4.9 4.0 - 10.5 K/uL   RBC 4.28 4.22 - 5.81 MIL/uL   Hemoglobin 13.4 13.0 - 17.0 g/dL   HCT 44.0 39 - 52 %   MCV 91.4 80.0 - 100.0 fL   MCH 31.3 26.0 - 34.0 pg   MCHC 34.3 30.0 - 36.0 g/dL   RDW 34.7 42.5 - 95.6 %   Platelets 499 (H) 150 - 400 K/uL   nRBC 0.0 0.0 - 0.2 %   Neutrophils Relative % 52 %    Neutro Abs 2.5 1.7 - 7.7 K/uL   Lymphocytes Relative 32 %   Lymphs Abs 1.6 0.7 - 4.0 K/uL   Monocytes Relative 16 %   Monocytes Absolute 0.8 0.1 - 1.0 K/uL   Eosinophils Relative 0 %   Eosinophils Absolute 0.0 0 - 0 K/uL   Basophils Relative 0 %   Basophils Absolute 0.0 0 - 0 K/uL   Immature Granulocytes 0 %   Abs Immature Granulocytes 0.02 0.00 - 0.07 K/uL    Comment: Performed at [facility] Lab, [address] [place], [place] [address]  Magnesium     Status: Abnormal   Collection Time:  07/29/20  2:18 AM  Result Value Ref Range   Magnesium 1.6 (L) 1.7 - 2.4 mg/dL    Comment: Performed at [facility] Lab, [address], [place], [place] [address]  Phosphorus     Status: None   Collection Time: 07/29/20  2:18 AM  Result Value Ref Range   Phosphorus 4.0 2.5 - 4.6 mg/dL    Comment: Performed at [facility] Lab, [address] [place], [place] [address]  Glucose, capillary     Status: None   Collection Time: 07/29/20  6:15 AM  Result Value Ref Range   Glucose-Capillary 97 70 - 99 mg/dL    Comment: Glucose reference range applies only to samples taken after fasting for at least 8 hours.    Recent Results (from the past 240 hour(s))  Respiratory Panel by RT PCR (Flu A&B, Covid) - Nasopharyngeal Swab     Status: Abnormal   Collection Time: 07/27/20  5:39 PM   Specimen: Nasopharyngeal Swab  Result Value Ref Range Status   SARS Coronavirus 2 by RT PCR POSITIVE (A) NEGATIVE Final    Comment: RESULT CALLED TO, READ BACK BY AND VERIFIED WITHHolley Bouche RN 1918 07/27/20 A BROWNING (NOTE) SARS-CoV-2 target nucleic acids are DETECTED.  SARS-CoV-2 RNA is generally detectable in upper respiratory specimens  during the acute phase of infection. Positive results are indicative of the presence of the identified virus, but do not rule out bacterial infection or co-infection with other pathogens not detected by the test. Clinical correlation with patient history and other  diagnostic information is necessary to determine patient infection status. The expected result is Negative.  Fact Sheet for Patients:  https://www.moore.com/  Fact Sheet for Healthcare Providers: https://www.young.biz/  This test is not yet approved or cleared by the [place] FDA and  has been authorized for detection and/or diagnosis of SARS-CoV-2 by FDA under an Emergency Use Authorization (EUA).  This EUA will remain in effect (meaning this test ca n be used) for the duration of  the COVID-19 declaration under Section 564(b)(1) of the Act, 21 U.S.C. section 360bbb-3(b)(1), unless the authorization is terminated or revoked sooner.      Influenza A by PCR NEGATIVE NEGATIVE Final   Influenza B by PCR NEGATIVE NEGATIVE Final    Comment: (NOTE) The Xpert Xpress SARS-CoV-2/FLU/RSV assay is intended as an aid in  the diagnosis of influenza from Nasopharyngeal swab specimens and  should not be used as a sole basis for treatment. Nasal washings and  aspirates are unacceptable for Xpert Xpress SARS-CoV-2/FLU/RSV  testing.  Fact Sheet for Patients: https://www.moore.com/  Fact Sheet for Healthcare Providers: https://www.young.biz/  This test is not yet approved or cleared by the [place] FDA and  has been authorized for detection and/or diagnosis of SARS-CoV-2 by  FDA under an Emergency Use Authorization (EUA). This EUA will remain  in effect (meaning this test can be used) for the duration of the  Covid-19 declaration under Section 564(b)(1) of the Act, 21  U.S.C. section 360bbb-3(b)(1), unless the authorization is  terminated or revoked. Performed at [facility] Lab, [address] [place], [place] [address]     Lipid Panel No results for input(s): CHOL, TRIG, HDL, CHOLHDL, VLDL, LDLCALC in the last 72 hours.  Studies/Results: EEG  Result Date: 07/27/2020 Charlsie Quest, MD      07/27/2020  2:53 PM Patient Name: Isaiah Spencer MRN: [id number] Epilepsy Attending: Charlsie Quest Referring Physician/Provider: Dr. Caryl Pina Date:  07/27/2020 Duration: 26.31 mins Patient history: 54 year old male with new onset seizures.  EEG to evaluate for seizures. Level of alertness: Awake, asleep AEDs during EEG study: None Technical aspects: This EEG study was done with scalp electrodes positioned according to the 10-20 International system of electrode placement. Electrical activity was acquired at a sampling rate of 500Hz  and reviewed with a high frequency filter of 70Hz  and a low frequency filter of 1Hz . EEG data were recorded continuously and digitally stored. Description: The posterior dominant rhythm consists of 10 Hz activity of moderate voltage (25-35 uV) seen predominantly in posterior head regions, symmetric and reactive to eye opening and eye closing. Sleep was characterized by vertex waves, sleep spindles (12 to 14 Hz), maximal frontocentral region.  Sharp transients were seen in left frontocentral region at times periodic at 1 Hz without definite evolution predominantly during sleep and improved once patient woke up. The morphology and distribution as well as presence in sleep with normal awake EEG make it suspicious for them being artifactual in nature.  Physiology photic driving was seen during photic stimulation.  Hyperventilation was not performed.   IMPRESSION: This study is within normal limits. No seizures or definite epileptiform discharges were seen throughout the recording.  Sharp transients were seen in left frontocentral region which seem to be artifactual in nature.  Recommend repeat EEG if concern for ictal-interictal activity persists. Charlsie Questriyanka O Yadav   CT Head Wo Contrast  Result Date: 07/27/2020 CLINICAL DATA:  Seizure EXAM: CT HEAD WITHOUT CONTRAST TECHNIQUE: Contiguous axial images were obtained from the base of the skull through the vertex without intravenous  contrast. COMPARISON:  2015 FINDINGS: Brain: There is no acute intracranial hemorrhage, mass effect, or edema. Gray-white differentiation is preserved. There is no extra-axial fluid collection. Ventricles and sulci are within normal limits in size and configuration. Vascular: No hyperdense vessel or unexpected calcification. Skull: Calvarium is unremarkable. Sinuses/Orbits: No acute finding. Other: None. IMPRESSION: No acute intracranial abnormality. Electronically Signed   By: Guadlupe SpanishPraneil  Patel M.D.   On: 07/27/2020 10:40   MR Brain W and Wo Contrast  Result Date: 07/28/2020 CLINICAL DATA:  Nontraumatic seizure EXAM: MRI HEAD WITHOUT AND WITH CONTRAST TECHNIQUE: Multiplanar, multiecho pulse sequences of the brain and surrounding structures were obtained without and with intravenous contrast. CONTRAST:  8mL GADAVIST GADOBUTROL 1 MMOL/ML IV SOLN COMPARISON:  Head CT from yesterday FINDINGS: Brain: No acute infarction, hemorrhage, hydrocephalus, extra-axial collection or mass lesion. Symmetric unremarkable hippocampus appearance. Vascular: Normal flow voids and vascular enhancement Skull and upper cervical spine: Normal marrow signal Sinuses/Orbits: Mild generalized mucosal thickening in the ethmoid sinuses with small retention cysts along the maxillary sinus floors. Other: Artifact from earrings, which were not able to be removed. IMPRESSION: Negative brain MRI.  No acute finding or visible seizure focus. Electronically Signed   By: Marnee SpringJonathon  Watts M.D.   On: 07/28/2020 08:06   DG Chest Port 1 View  Result Date: 07/27/2020 CLINICAL DATA:  Seizure.  Recent COVID-19 positive EXAM: PORTABLE CHEST 1 VIEW COMPARISON:  July 19, 2020 FINDINGS: There is subtle ill-defined opacity in portions of the left mid and lower lung regions without consolidation. Heart size and pulmonary vascularity are normal. No adenopathy. No bone lesions. IMPRESSION: Ill-defined opacity in mid and lower lung zones again noted, likely due  to atypical organism pneumonia. No consolidation. Lungs otherwise clear. Heart size normal. No adenopathy. Electronically Signed   By: Bretta BangWilliam  Woodruff III M.D.   On: 07/27/2020 09:18   Overnight EEG with  video  Result Date: 07/29/2020 Charlsie Quest, MD     07/29/2020  6:35 AM Patient Name: Isaiah Spencer MRN: [id number] Epilepsy Attending: Charlsie Quest Referring Physician/Provider: Dr. Caryl Pina Duration: 07/28/2020 1130 to 07/28/2020 0630  Patient history: 54 year old male with new onset seizures.  EEG to evaluate for seizures.  Level of alertness: Awake, asleep  AEDs during EEG study: None  Technical aspects: This EEG study was done with scalp electrodes positioned according to the 10-20 International system of electrode placement. Electrical activity was acquired at a sampling rate of 500Hz  and reviewed with a high frequency filter of 70Hz  and a low frequency filter of 1Hz . EEG data were recorded continuously and digitally stored.  Description: The posterior dominant rhythm consists of 10 Hz activity of moderate voltage (25-35 uV) seen predominantly in posterior head regions, symmetric and reactive to eye opening and eye closing. Sleep was characterized by vertex waves, sleep spindles (12 to 14 Hz), maximal frontocentral region. Photic stimulation and hyperventilation was not performed.    IMPRESSION: This study is within normal limits. No seizures orepileptiform discharges were seen throughout the recording.   Priyanka Annabelle Harman    Medications:  Scheduled: . aspirin  325 mg Oral Daily  . insulin aspart  0-9 Units Subcutaneous TID WC  . levETIRAcetam  750 mg Oral BID  . lisinopril  2.5 mg Oral Daily  . LORazepam  1 mg Intravenous On Call  . multivitamin with minerals  1 tablet Oral Daily  . pantoprazole  40 mg Oral Daily  . [START ON 08/02/2020] Vitamin D (Ergocalciferol)  50,000 Units Oral Weekly   LTM EEG report for this AM: IMPRESSION: This study is within normal limits. No  seizures orepileptiform discharges were seen throughout the recording.  Assessment: 54 year old male with recent diagnosis of Covid pneumonia (9/22) who presented after a seizure and had another seizure while inpatient. New onset seizures -Patient had 2 seizures within 24 hours after admission.  However it appeared that seizures were focal as he had speech disturbance at onset and had an abnormal EEG with sharp transients in the left.  Therefore patient is at high risk of seizure recurrence. -Possible etiology of seizures is Covid related. However, MRI brain was normal. -Patient is afebrile with normal neurologic exam therefore low suspicion for meningitis/encephalitis. -LTM EEG overnight was normal. No further spells nor encephalopathy post ictal.   Recommendations: -Discontinuing LTM EEG (order placed).  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  -Out pt f/u with [facility] in 4wks- I put this in depart, but can't assist with appt since it is Sat.   In-pt team will s/o at this time. Please call back if needed.   LOS: 1 day   Desiree Metzger-Cihelka, ARNP-C, ANVP-BC Pager: [phone]  Electronically signed: Dr. Caryl Pina

## 2020-07-29 NOTE — Progress Notes (Signed)
OT Cancellation Note  Patient Details Name: Noa D [place] MRN: [id number] DOB: 08-03-1966   Cancelled Treatment:    Reason Eval/Treat Not Completed: Patient at procedure or test/ unavailable, pt still hooked up to EEG, RN reports will likely be disconnected soon. Will plan to follow up after for OT eval.   Marcy Siren, OT [facility] Pager [phone] Office [phone]   Orlando Penner 07/29/2020, 10:53 AM

## 2020-07-29 NOTE — Discharge Summary (Signed)
Physician Discharge Summary  Isaiah Spencer YNW:[id number] DOB: Mar 19, 1966 DOA: 07/27/2020  PCP: Isaiah Fellers, PA-C  Admit date: 07/27/2020 Discharge date: 07/29/2020  Admitted From: Home Disposition: Home  Recommendations for Outpatient Follow-up:  1. Follow up with PCP in 1-2 weeks 2. Follow up with Neurology within 1-2 weeks 3. Have Driving Restricitons 4. Please obtain CMP/CBC, Mag, Phos in one week 5. Please follow up on the following pending results:  Home Health: No  Equipment/Devices: None  Discharge Condition: Stable CODE STATUS: FULL CODE Diet recommendation: Carb Modified Diet   Brief/Interim Summary: HPI per Dr. Pamella Spencer on 07/27/20 Isaiah Spencer a 54 y.o.malewith medical history significant ofDM2, OSA, HTN, HLD, recent Covid on 9/22 s/p mAb not recovered, comes to the hospital with a seizure. Patient denies any prior history of seizures.Girlfriend is at bedside andstates that patient has been more forgetful in the past couple of days, very anxious, very fearful, trying to stay awake at night because he was afraid to fall asleep. He has beenunder a lot of stress recently, had a family member die of Covid and he himself had Covid and it appears that he was very scared. Today he had what looks like a generalized tonic-clonic seizure based on descriptions, lasted approximately 5 minutes and he bit his right side of the tongue and he was also found to be foaming at the mouth. He remains very anxious in the ED constantly watching the monitor and worrying about his blood pressure.He denies heavy alcohol intake and last time he drank was at the beach about couple weeks ago. No drugs. 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 Denies any headaches  ED Course:In the ED he is afebrile, normotensive and satting well on  room air. Labs are relatively unremarkable, lactic acid was mildly elevated but improved with fluids. Glucose 242. Mg 1.6. UA unremarkable, UDS negative. CT brain negative. Neurology was consulted and we are asked to admit.   **Interim History Multiple seizures and had one in the ED the day before yesterday.  Neurology evaluated and started him on antiepileptics and recommending LTM EEG monitoring.  Neurology has also recommended an LP given his 2 seizures but he has refused.  If he has another seizure they did strongly urge LP and have held his Lovenox for now.  He subsequently improved and his LTM was within normal limits.  Neurology talked to him about driving restrictions as I did and they recommended Keppra 750 twice daily.  They recommend outpatient follow-up with neurology in 4 weeks he is.  PT OT evaluated and had no follow-up.  He is stable for discharge and will need to follow-up with PCP and neurology in the outpatient setting in 1 to 2 weeks  Discharge Diagnoses:  Principal Problem:   Seizure (HCC) Active Problems:   Diabetes mellitus, type 2 (HCC)   OSA (obstructive sleep apnea)  New onset Seizures likely secondary to COVID-19 disease -Head CT done and showed "No acute intracranial abnormality." -Neurology consulted, appreciate input.  -Patient underwent an EEG in the ED which showed some sharp transients in the left frontocentral region which can meanpotential for epileptiform discharges versus artifacts. Patient's hands were shaking at that time.  -Neurology recommends overnight LTM EEG and have started the patient on Keppra  -MRI of the brain done and showed "Negative  brain MRI. 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 -Seizure Precautions -On lactated Ringer's at 125 MLS per hour but will reduce to 75 MLS per hour for 12 more hours -Neurology recommended LP but patient has refused this.  If he has no seizure then definitely recommending LP at this time. -Neurology cleared the patient for discharge and he is stable and will need to follow-up with them within 4 weeks  Type 2 Diabetes Mellitus -Hold oral agents while hospitalized but resume at discharge -HbA1c was 6.9 -Placed on Sensitive Novolog SSI AC -CBG's ranging from 90-144  GERD -C/w Pantoprazole 40 mg po Daily  OSA -Does not use a CPAP  Essential Hypertension -Continue home medications with Lisinopril 2.5 mg po Daily  -The last blood pressure reading was 141/89 prior to discharge  Recent COVID-19 -Tested positive for 9/22 and SARS-CoV-2 Positive here,  -Completely recovered, no current symptoms, does not need airborne precautions  Thrombocytosis -Likely Reactive -Patient's Platelet Count went from 548 -> 491 -> 499 -Continue to Monitor and Trend -Repeat CBC in the AM  Hyperbilirubinemia -Mild at 1.3 and trended up to 1.6 and is now trending back down to 1.4 -Continue to Monitor and Trend -Repeat CMP within 1-2 weeks  Discharge Instructions Discharge Instructions    Call MD for:  difficulty breathing, headache or visual disturbances   Complete by: As directed    Call MD for:  extreme fatigue   Complete by: As directed    Call MD for:  hives   Complete by: As directed    Call MD for:  persistant dizziness or light-headedness   Complete by: As directed    Call MD for:  persistant nausea and vomiting   Complete by: As directed    Call MD for:  redness, tenderness, or signs of infection (pain, swelling, redness, odor or green/yellow discharge around incision site)   Complete by: As directed    Call MD for:  severe uncontrolled pain   Complete by: As directed    Call MD for:  temperature >100.4    Complete by: As directed    Diet - low sodium heart healthy   Complete by: As directed    Diet Carb Modified   Complete by: As directed    Discharge instructions   Complete by: As directed    You were cared for by a hospitalist during your hospital stay. If you have any questions about your discharge medications or the care you received while you were in the hospital after you are discharged, you can call the unit and ask to speak with the hospitalist on call if the hospitalist that took care of you is not available. Once you are discharged, your primary care physician will handle any further medical issues. Please note that NO REFILLS for any discharge medications will be authorized once you are discharged, as it is imperative that you return to your primary care physician (or establish a relationship with a primary care physician if you do not have one) for your aftercare needs so that they can reassess your need for medications and monitor your lab values.  Follow up with PCP and Neurology within 1-2 weeks. Take all medications as prescribed. If symptoms change or worsen please  return to the ED for evaluation   Driving Restrictions   Complete by: As directed    No Driving for at least 6 months per [place] law; Have to be Seizure Period 6 months   Increase activity slowly   Complete by: As directed    Lifting restrictions   Complete by: As directed    No Heavy Lifting or operating Heavy Machinery   Other Restrictions   Complete by: As directed    Take Showers instead of baths     Allergies as of 07/29/2020      Reactions   Amoxicillin-pot Clavulanate Other (See Comments)   Upsets stomach   Penicillins Hives   Breaks out in hives Has patient had a PCN reaction causing immediate rash, facial/tongue/throat swelling, SOB or lightheadedness with hypotension: No Has patient had a PCN reaction causing severe rash involving mucus membranes or skin necrosis: No Has patient had a PCN reaction that  required hospitalization: No Has patient had a PCN reaction occurring within the last 10 years: No If all of the above answers are "NO", then may proceed with Cephalosporin use.      Medication List    STOP taking these medications   cyclobenzaprine 10 MG tablet Commonly known as: FLEXERIL   lidocaine 5 % Commonly known as: Lidoderm     TAKE these medications   aspirin 325 MG tablet Take 325 mg by mouth daily.   docusate sodium 100 MG capsule Commonly known as: COLACE Take 100 mg by mouth daily.   ergocalciferol 1.25 MG (50000 UT) capsule Commonly known as: VITAMIN D2 Take 50,000 Units by mouth once a week. Tuesday   hydrOXYzine 10 MG tablet Commonly known as: ATARAX/VISTARIL Take 1 tablet (10 mg total) by mouth every 6 (six) hours as needed for anxiety.   levETIRAcetam 750 MG tablet Commonly known as: KEPPRA Take 1 tablet (750 mg total) by mouth 2 (two) times daily.   lisinopril 2.5 MG tablet Commonly known as: ZESTRIL Take 2.5 mg by mouth daily.   metFORMIN 1000 MG tablet Commonly known as: GLUCOPHAGE Take 1,000 mg by mouth 2 (two) times daily.   MULTI FOR HIM 50+ PO Take 1 tablet by mouth daily.   omeprazole 40 MG capsule Commonly known as: PRILOSEC Take 40 mg by mouth daily.   ondansetron 4 MG tablet Commonly known as: ZOFRAN Take 1 tablet (4 mg total) by mouth every 6 (six) hours as needed for nausea.       Follow-up Information    [facility]. Schedule an appointment as soon as possible for a visit in 4 week(s).   Specialty: Neurology Contact information: [address] [place] [address] [phone]             Allergies  Allergen Reactions  . Amoxicillin-Pot Clavulanate Other (See Comments)    Upsets stomach  . Penicillins Hives    Breaks out in hives Has patient had a PCN reaction causing immediate rash, facial/tongue/throat swelling, SOB or lightheadedness with hypotension: No Has  patient had a PCN reaction causing severe rash involving mucus membranes or skin necrosis: No Has patient had a PCN reaction that required hospitalization: No Has patient had a PCN reaction occurring within the last 10 years: No If all of the above answers are "NO", then may proceed with Cephalosporin use.    Consultations:  Neurology  Procedures/Studies: EEG  Result Date: 07/27/2020 Charlsie Quest, MD     07/27/2020  2:53 PM Patient  Name: Isaiah Spencer MRN: [id number] Epilepsy Attending: Charlsie Quest Referring Physician/Provider: Dr. Caryl Pina Date: 07/27/2020 Duration: 26.31 mins Patient history: 54 year old male with new onset seizures.  EEG to evaluate for seizures. Level of alertness: Awake, asleep AEDs during EEG study: None Technical aspects: This EEG study was done with scalp electrodes positioned according to the 10-20 International system of electrode placement. Electrical activity was acquired at a sampling rate of 500Hz  and reviewed with a high frequency filter of 70Hz  and a low frequency filter of 1Hz . EEG data were recorded continuously and digitally stored. Description: The posterior dominant rhythm consists of 10 Hz activity of moderate voltage (25-35 uV) seen predominantly in posterior head regions, symmetric and reactive to eye opening and eye closing. Sleep was characterized by vertex waves, sleep spindles (12 to 14 Hz), maximal frontocentral region.  Sharp transients were seen in left frontocentral region at times periodic at 1 Hz without definite evolution predominantly during sleep and improved once patient woke up. The morphology and distribution as well as presence in sleep with normal awake EEG make it suspicious for them being artifactual in nature.  Physiology photic driving was seen during photic stimulation.  Hyperventilation was not performed.   IMPRESSION: This study is within normal limits. No seizures or definite epileptiform discharges were seen throughout the  recording.  Sharp transients were seen in left frontocentral region which seem to be artifactual in nature.  Recommend repeat EEG if concern for ictal-interictal activity persists. Charlsie Quest   CT Head Wo Contrast  Result Date: 07/27/2020 CLINICAL DATA:  Seizure EXAM: CT HEAD WITHOUT CONTRAST TECHNIQUE: Contiguous axial images were obtained from the base of the skull through the vertex without intravenous contrast. COMPARISON:  2015 FINDINGS: Brain: There is no acute intracranial hemorrhage, mass effect, or edema. Gray-white differentiation is preserved. There is no extra-axial fluid collection. Ventricles and sulci are within normal limits in size and configuration. Vascular: No hyperdense vessel or unexpected calcification. Skull: Calvarium is unremarkable. Sinuses/Orbits: No acute finding. Other: None. IMPRESSION: No acute intracranial abnormality. Electronically Signed   By: Guadlupe Spanish M.D.   On: 07/27/2020 10:40   MR Brain W and Wo Contrast  Result Date: 07/28/2020 CLINICAL DATA:  Nontraumatic seizure EXAM: MRI HEAD WITHOUT AND WITH CONTRAST TECHNIQUE: Multiplanar, multiecho pulse sequences of the brain and surrounding structures were obtained without and with intravenous contrast. CONTRAST:  8mL GADAVIST GADOBUTROL 1 MMOL/ML IV SOLN COMPARISON:  Head CT from yesterday FINDINGS: Brain: No acute infarction, hemorrhage, hydrocephalus, extra-axial collection or mass lesion. Symmetric unremarkable hippocampus appearance. Vascular: Normal flow voids and vascular enhancement Skull and upper cervical spine: Normal marrow signal Sinuses/Orbits: Mild generalized mucosal thickening in the ethmoid sinuses with small retention cysts along the maxillary sinus floors. Other: Artifact from earrings, which were not able to be removed. IMPRESSION: Negative brain MRI.  No acute finding or visible seizure focus. Electronically Signed   By: Marnee Spring M.D.   On: 07/28/2020 08:06   DG Chest Port 1  View  Result Date: 07/27/2020 CLINICAL DATA:  Seizure.  Recent COVID-19 positive EXAM: PORTABLE CHEST 1 VIEW COMPARISON:  July 19, 2020 FINDINGS: There is subtle ill-defined opacity in portions of the left mid and lower lung regions without consolidation. Heart size and pulmonary vascularity are normal. No adenopathy. No bone lesions. IMPRESSION: Ill-defined opacity in mid and lower lung zones again noted, likely due to atypical organism pneumonia. No consolidation. Lungs otherwise clear. Heart size normal. No adenopathy. Electronically Signed  By: Bretta BangWilliam  Woodruff III M.D.   On: 07/27/2020 09:18   DG Chest Portable 1 View  Result Date: 07/19/2020 CLINICAL DATA:  COVID positive, shortness of breath, cough. EXAM: PORTABLE CHEST 1 VIEW COMPARISON:  Prior chest radiograph 05/13/2017 and earlier FINDINGS: Heart size within normal limits. There are faint multifocal interstitial and ill-defined airspace opacities bilaterally (greater on the right). There is no evidence of pleural effusion or pneumothorax. No acute bony abnormality identified. IMPRESSION: Faint multifocal interstitial and ill-defined airspace opacities bilaterally. Findings likely reflect atypical/viral pneumonia given the provided history. Electronically Signed   By: Jackey LogeKyle  Golden DO   On: 07/19/2020 08:07   Overnight EEG with video  Result Date: 07/29/2020 Charlsie QuestYadav, Priyanka O, MD     07/29/2020  6:35 AM Patient Name: Isaiah D [place] MRN: [id number] Epilepsy Attending: Charlsie QuestPriyanka O Yadav Referring Physician/Provider: Dr. Caryl PinaEric Lindzen Duration: 07/28/2020 1130 to 07/28/2020 0630  Patient history: 54 year old male with new onset seizures.  EEG to evaluate for seizures.  Level of alertness: Awake, asleep  AEDs during EEG study: None  Technical aspects: This EEG study was done with scalp electrodes positioned according to the 10-20 International system of electrode placement. Electrical activity was acquired at a sampling rate of 500Hz  and reviewed  with a high frequency filter of 70Hz  and a low frequency filter of 1Hz . EEG data were recorded continuously and digitally stored.  Description: The posterior dominant rhythm consists of 10 Hz activity of moderate voltage (25-35 uV) seen predominantly in posterior head regions, symmetric and reactive to eye opening and eye closing. Sleep was characterized by vertex waves, sleep spindles (12 to 14 Hz), maximal frontocentral region. Photic stimulation and hyperventilation was not performed.    IMPRESSION: This study is within normal limits. No seizures orepileptiform discharges were seen throughout the recording.   Priyanka Annabelle Harman Yadav    Subjective: Seen and examined at bedside and he felt well.  He denied any chest pain, lightheadedness or dizziness.  No nausea or vomiting.  Had no more seizure episodes.  Neurology cleared the patient for discharge and he will be going on antiepileptics and is advised not to drive until he is at least seizure-free for 6 months per [facility].  Is stable and following up with PCP and neurology in the coming weeks.  Discharge Exam: Vitals:   07/29/20 0040 07/29/20 0620  BP: (!) 143/86 (!) 141/89  Pulse: 62 (!) 52  Resp: 18 16  Temp: 98.4 F (36.9 C) 97.7 F (36.5 C)  SpO2: 98% 100%   Vitals:   07/28/20 1751 07/28/20 2149 07/29/20 0040 07/29/20 0620  BP: (!) 141/78 119/85 (!) 143/86 (!) 141/89  Pulse: 66 70 62 (!) 52  Resp: 20 18 18 16   Temp: 98.6 F (37 C) 98.5 F (36.9 C) 98.4 F (36.9 C) 97.7 F (36.5 C)  TempSrc: Oral Oral Oral Oral  SpO2: 98% 97% 98% 100%  Weight:      Height:       General: Pt is alert, awake, not in acute distress Cardiovascular: RRR, S1/S2 +, no rubs, no gallops Respiratory: Slightly diminished bilaterally, no wheezing, no rhonchi Abdominal: Soft, NT, ND, bowel sounds + Extremities: no edema, no cyanosis  The results of significant diagnostics from this hospitalization (including imaging, microbiology, ancillary and  laboratory) are listed below for reference.    Microbiology: Recent Results (from the past 240 hour(s))  Respiratory Panel by RT PCR (Flu A&B, Covid) - Nasopharyngeal Swab     Status: Abnormal  Collection Time: 07/27/20  5:39 PM   Specimen: Nasopharyngeal Swab  Result Value Ref Range Status   SARS Coronavirus 2 by RT PCR POSITIVE (A) NEGATIVE Final    Comment: RESULT CALLED TO, READ BACK BY AND VERIFIED WITHHolley Bouche RN 1918 07/27/20 A BROWNING (NOTE) SARS-CoV-2 target nucleic acids are DETECTED.  SARS-CoV-2 RNA is generally detectable in upper respiratory specimens  during the acute phase of infection. Positive results are indicative of the presence of the identified virus, but do not rule out bacterial infection or co-infection with other pathogens not detected by the test. Clinical correlation with patient history and other diagnostic information is necessary to determine patient infection status. The expected result is Negative.  Fact Sheet for Patients:  https://www.moore.com/  Fact Sheet for Healthcare Providers: https://www.young.biz/  This test is not yet approved or cleared by the [place] FDA and  has been authorized for detection and/or diagnosis of SARS-CoV-2 by FDA under an Emergency Use Authorization (EUA).  This EUA will remain in effect (meaning this test ca n be used) for the duration of  the COVID-19 declaration under Section 564(b)(1) of the Act, 21 U.S.C. section 360bbb-3(b)(1), unless the authorization is terminated or revoked sooner.      Influenza A by PCR NEGATIVE NEGATIVE Final   Influenza B by PCR NEGATIVE NEGATIVE Final    Comment: (NOTE) The Xpert Xpress SARS-CoV-2/FLU/RSV assay is intended as an aid in  the diagnosis of influenza from Nasopharyngeal swab specimens and  should not be used as a sole basis for treatment. Nasal washings and  aspirates are unacceptable for Xpert Xpress  SARS-CoV-2/FLU/RSV  testing.  Fact Sheet for Patients: https://www.moore.com/  Fact Sheet for Healthcare Providers: https://www.young.biz/  This test is not yet approved or cleared by the [place] FDA and  has been authorized for detection and/or diagnosis of SARS-CoV-2 by  FDA under an Emergency Use Authorization (EUA). This EUA will remain  in effect (meaning this test can be used) for the duration of the  Covid-19 declaration under Section 564(b)(1) of the Act, 21  U.S.C. section 360bbb-3(b)(1), unless the authorization is  terminated or revoked. Performed at [facility], [address], [place], [place] [address]     Labs: BNP (last 3 results) No results for input(s): BNP in the last 8760 hours. Basic Metabolic Panel: Recent Labs  Lab 07/27/20 0906 07/27/20 1055 07/28/20 0925 07/29/20 0218  NA 139  --  142 141  K 3.6  --  3.8 3.7  CL 104  --  108 107  CO2 22  --  21* 24  GLUCOSE 242*  --  86 116*  BUN 9  --  10 10  CREATININE 0.81  --  0.92 0.83  CALCIUM 9.2  --  9.0 8.8*  MG 1.6* 1.7 1.7 1.6*  PHOS  --   --  3.8 4.0   Liver Function Tests: Recent Labs  Lab 07/27/20 0906 07/28/20 0925 07/29/20 0218  AST 36 47* 38  ALT 109* 91* 74*  ALKPHOS 90 75 73  BILITOT 1.3* 1.6* 1.4*  PROT 7.1 6.7 6.3*  ALBUMIN 3.1* 2.9* 2.7*   No results for input(s): LIPASE, AMYLASE in the last 168 hours. No results for input(s): AMMONIA in the last 168 hours. CBC: Recent Labs  Lab 07/27/20 0906 07/28/20 0526 07/29/20 0218  WBC 7.3 6.6 4.9  NEUTROABS 5.9  --  2.5  HGB 13.8 13.5 13.4  HCT 40.9 40.1 39.1  MCV 89.9 91.1  91.4  PLT 548* 491* 499*   Cardiac Enzymes: No results for input(s): CKTOTAL, CKMB, CKMBINDEX, TROPONINI in the last 168 hours. BNP: Invalid input(s): POCBNP CBG: Recent Labs  Lab 07/28/20 1233 07/28/20 1742 07/28/20 2146 07/29/20 0615 07/29/20 1212  GLUCAP 90 89 144* 97 129*   D-Dimer No  results for input(s): DDIMER in the last 72 hours. Hgb A1c Recent Labs    07/27/20 1635  HGBA1C 6.9*   Lipid Profile No results for input(s): CHOL, HDL, LDLCALC, TRIG, CHOLHDL, LDLDIRECT in the last 72 hours. Thyroid function studies No results for input(s): TSH, T4TOTAL, T3FREE, THYROIDAB in the last 72 hours.  Invalid input(s): FREET3 Anemia work up No results for input(s): VITAMINB12, FOLATE, FERRITIN, TIBC, IRON, RETICCTPCT in the last 72 hours. Urinalysis    Component Value Date/Time   COLORURINE YELLOW 07/27/2020 0917   APPEARANCEUR CLEAR 07/27/2020 0917   LABSPEC 1.016 07/27/2020 0917   PHURINE 5.0 07/27/2020 0917   GLUCOSEU 50 (A) 07/27/2020 0917   HGBUR NEGATIVE 07/27/2020 0917   BILIRUBINUR NEGATIVE 07/27/2020 0917   BILIRUBINUR neg 02/16/2014 1905   KETONESUR 80 (A) 07/27/2020 0917   PROTEINUR 100 (A) 07/27/2020 0917   UROBILINOGEN 1.0 02/16/2014 1905   NITRITE NEGATIVE 07/27/2020 0917   LEUKOCYTESUR NEGATIVE 07/27/2020 0917   Sepsis Labs Invalid input(s): PROCALCITONIN,  WBC,  LACTICIDVEN Microbiology Recent Results (from the past 240 hour(s))  Respiratory Panel by RT PCR (Flu A&B, Covid) - Nasopharyngeal Swab     Status: Abnormal   Collection Time: 07/27/20  5:39 PM   Specimen: Nasopharyngeal Swab  Result Value Ref Range Status   SARS Coronavirus 2 by RT PCR POSITIVE (A) NEGATIVE Final    Comment: RESULT CALLED TO, READ BACK BY AND VERIFIED WITHHolley Bouche RN 1918 07/27/20 A BROWNING (NOTE) SARS-CoV-2 target nucleic acids are DETECTED.  SARS-CoV-2 RNA is generally detectable in upper respiratory specimens  during the acute phase of infection. Positive results are indicative of the presence of the identified virus, but do not rule out bacterial infection or co-infection with other pathogens not detected by the test. Clinical correlation with patient history and other diagnostic information is necessary to determine patient infection status. The  expected result is Negative.  Fact Sheet for Patients:  https://www.moore.com/  Fact Sheet for Healthcare Providers: https://www.young.biz/  This test is not yet approved or cleared by the [place] FDA and  has been authorized for detection and/or diagnosis of SARS-CoV-2 by FDA under an Emergency Use Authorization (EUA).  This EUA will remain in effect (meaning this test ca n be used) for the duration of  the COVID-19 declaration under Section 564(b)(1) of the Act, 21 U.S.C. section 360bbb-3(b)(1), unless the authorization is terminated or revoked sooner.      Influenza A by PCR NEGATIVE NEGATIVE Final   Influenza B by PCR NEGATIVE NEGATIVE Final    Comment: (NOTE) The Xpert Xpress SARS-CoV-2/FLU/RSV assay is intended as an aid in  the diagnosis of influenza from Nasopharyngeal swab specimens and  should not be used as a sole basis for treatment. Nasal washings and  aspirates are unacceptable for Xpert Xpress SARS-CoV-2/FLU/RSV  testing.  Fact Sheet for Patients: https://www.moore.com/  Fact Sheet for Healthcare Providers: [email or web address].[email or web address]/  This test is not yet approved or cleared by the [place] FDA and  has been authorized for detection and/or diagnosis of SARS-CoV-2 by  FDA under an Emergency Use Authorization (EUA). This EUA will remain  in effect (meaning this test can be used)  for the duration of the  Covid-19 declaration under Section 564(b)(1) of the Act, 21  U.S.C. section 360bbb-3(b)(1), unless the authorization is  terminated or revoked. Performed at [facility], [address] [place], [place] [address]    Time coordinating discharge: 35 minutes  SIGNED:  Merlene Laughter, DO [facility] 07/29/2020, 6:27 PM Pager is on AMION  If 7PM-7AM, please contact night-coverage [email or web address]

## 2020-07-29 NOTE — Evaluation (Signed)
Occupational Therapy Evaluation Patient Details Name: Isaiah Spencer MRN: [id number] DOB: 1966-01-03 Today's Date: 07/29/2020    History of Present Illness 54 year old male with recent diagnosis of Covid pneumonia (9/22) who presented after a seizure and had another seizure while inpatient. PMHx includes DM, OSA, asthma    Clinical Impression   This 54 y/o male presents with the above. Pt presenting close to (if not fully returned to) his baseline with ADL and mobility tasks at this time. Pt presents with good strength throughout, mobilizing/performing multiple laps in room independently during session with no overt LOB noted. He completed majority of ADL prior to OT arrival (including dressing) and reports no difficulties throughout. Pt reports feeling at his baseline and is eager to discharge home. Pt planning to return home with significant other/family who are available to assist PRN. Educated in general activity progression after return home and encouraged continued up ad lib while he remains admitted and after return home for continued strengthening/increasing endurance. Pt verbalizing understanding with no further questions, acute OT to sign off at this time as pt with no additional OT needs identified. Please re-consult should pt's needs change. Thank you for this referral.     Follow Up Recommendations  No OT follow up;Supervision - Intermittent    Equipment Recommendations  None recommended by OT           Precautions / Restrictions Precautions Precautions: Fall Precaution Comments: seizure Restrictions Weight Bearing Restrictions: No      Mobility Bed Mobility               General bed mobility comments: OOB upon arrival, left seated EOB   Transfers Overall transfer level: Independent Equipment used: None             General transfer comment: x2 sit<>stand during session     Balance Overall balance assessment: No apparent balance deficits (not formally  assessed)                                         ADL either performed or assessed with clinical judgement   ADL Overall ADL's : At baseline                                       General ADL Comments: pt up ad lib in room, completed x3 laps in room this session at independent level, mild sway with mobility but no LOB. pt reports feeling at his baseline, already dressed upon arrival. he denies dizziness/lightheadedness with activity                          Pertinent Vitals/Pain Pain Assessment: No/denies pain     Hand Dominance     Extremity/Trunk Assessment Upper Extremity Assessment Upper Extremity Assessment: Overall WFL for tasks assessed   Lower Extremity Assessment Lower Extremity Assessment: Overall WFL for tasks assessed   Cervical / Trunk Assessment Cervical / Trunk Assessment: Normal   Communication Communication Communication: No difficulties   Cognition Arousal/Alertness: Awake/alert Behavior During Therapy: WFL for tasks assessed/performed Overall Cognitive Status: Within Functional Limits for tasks assessed  General Comments: not formally assessed but appears WFL during conversation and tasks completed, pt relaying to OT information he'd learned from MD including new medications he's going home with, etc    General Comments       Exercises     Shoulder Instructions      Home Living Family/patient expects to be discharged to:: Private residence Living Arrangements: Spouse/significant other;Parent Available Help at Discharge: Family Type of Home: House Home Access: Stairs to enter Secretary/administrator of Steps: 2   Home Layout: One level     Bathroom Shower/Tub: Chief Strategy Officer: Standard     Home Equipment: None          Prior Functioning/Environment Level of Independence: Independent        Comments: works as a Teacher, English as a foreign language: Decreased activity tolerance;Decreased knowledge of precautions      OT Treatment/Interventions:      OT Goals(Current goals can be found in the care plan section) Acute Rehab OT Goals Patient Stated Goal: home today OT Goal Formulation: All assessment and education complete, DC therapy  OT Frequency:     Barriers to D/C:            Co-evaluation              AM-PAC OT "6 Clicks" Daily Activity     Outcome Measure Help from another person eating meals?: None Help from another person taking care of personal grooming?: None Help from another person toileting, which includes using toliet, bedpan, or urinal?: None Help from another person bathing (including washing, rinsing, drying)?: None Help from another person to put on and taking off regular upper body clothing?: None Help from another person to put on and taking off regular lower body clothing?: None 6 Click Score: 24   End of Session Nurse Communication: Mobility status  Activity Tolerance: Patient tolerated treatment well Patient left: with call bell/phone within reach;Other (comment) (seated EOB)  OT Visit Diagnosis: Other symptoms and signs involving the nervous system (P59.163)                Time: 8466-5993 OT Time Calculation (min): 12 min Charges:  OT General Charges $OT Visit: 1 Visit OT Evaluation $OT Eval Moderate Complexity: 1 Mod  Marcy Siren, OT Acute Rehabilitation Services Pager [phone] Office [phone]   Orlando Penner 07/29/2020, 2:20 PM

## 2020-07-29 NOTE — Progress Notes (Signed)
PT Cancellation/Discharge Note  Patient Details Name: Isaiah Spencer MRN: [id number] DOB: 1966-06-12   Cancelled Treatment:    Reason Eval/Treat Not Completed: PT screened, no needs identified, will sign off.  OT screened pt for PT needs no needs identified and sign off recommended by OT.  PT to sign off.  Sounds like pt is at baseline and ready for d/c.  Thanks,  Corinna Capra, PT, DPT  Acute Rehabilitation [phone] pager #[phone] office       [name] 07/29/2020, 2:15 PM

## 2020-07-29 NOTE — Care Management (Signed)
Spoke to patient about DC meds. He states he usually uses Good Rx cards. Spoke to him about convenience of Good Rx app.  Obtained new cell phone number [phone] from him and texted him the app to download. Patient appreciative.

## 2020-08-07 ENCOUNTER — Ambulatory Visit: Payer: Self-pay | Admitting: Neurology

## 2020-08-07 ENCOUNTER — Encounter: Payer: Self-pay | Admitting: Neurology

## 2020-08-07 VITALS — BP 105/67 | HR 94 | Ht 73.0 in | Wt 198.0 lb

## 2020-08-07 DIAGNOSIS — G40009 Localization-related (focal) (partial) idiopathic epilepsy and epileptic syndromes with seizures of localized onset, not intractable, without status epilepticus: Secondary | ICD-10-CM

## 2020-08-07 MED ORDER — LEVETIRACETAM 750 MG PO TABS
750.0000 mg | ORAL_TABLET | Freq: Two times a day (BID) | ORAL | 5 refills | Status: DC
Start: 2020-08-07 — End: 2020-12-18
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
## 2020-08-07 NOTE — Patient Instructions (Signed)
I had a long discussion with the patient and his fiance regarding his recent episodes of seizures x2 in the setting of Covid infection and recommend he continue Keppra 750 mg twice daily for seizure prophylaxis which he seems to be tolerating well without side effects.  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  I advised him to avoid seizure provoking stimuli like sleep deprivation, medication noncompliance, stimulants like alcohol, recreational drugs as well as extremes of physical activity and diet change.  He will return for follow-up in the future in 3 months with my nurse practitioner Shanda Bumps or call earlier if necessary.   Epilepsy Epilepsy is when a person keeps having seizures. A seizure is a burst of abnormal activity in the brain. A seizure can change how you think or behave, and it can make it hard to be aware of what is happening. This condition can cause problems such as:  Falls, accidents, and injury.  Sadness (depression).  Poor memory.  Sudden unexplained death in epilepsy (SUDEP). This is rare. Its cause is not known. Most people with epilepsy lead normal lives. What are the causes? This condition may be caused by:  A head injury.  An injury that happens at birth.  A high fever during childhood.  A stroke.  Bleeding that goes into or around the brain.  Certain medicines and drugs.  Having too little oxygen for a long period of time.  Abnormal brain development.  Certain infections.  Brain tumors.  Conditions that are passed from parent to child (are hereditary). What are the signs or symptoms? Symptoms of a seizure vary from person to person. They may include:  Jerky movements of muscles (convulsions).  Stiffening of the body.  Movements of the arms or legs that you are not able to control.  Passing out (loss of consciousness).  Breathing problems.  Sudden  falls.  Confusion.  Head nodding.  Eye blinking or twitching.  Lip smacking.  Drooling.  Fast eye movements.  Grunting.  Not being able to control when you pee or poop.  Staring.  Being hard to wake up (unresponsiveness). Some people have symptoms right before a seizure happens (aura) and right after a seizure happens. These symptoms include:  Fear or anxiety.  Feeling sick to your stomach (nauseous).  Feeling like the room is spinning (vertigo).  A feeling of having seen or heard something before (dj vu).  Odd tastes or smells.  Changes in how you see (vision), such as seeing flashing lights or spots. Symptoms that follow a seizure include:  Being confused.  Being sleepy.  Having a headache. How is this treated? Treatment can control seizures. Treatment for this condition may involve:  Taking medicines to control seizures.  Having a device (vagus nerve stimulator) put in the chest. The device sends signals to a nerve and to the brain to prevent seizures.  Brain surgery to stop seizures from happening or to reduce how often they happen.  Having blood tests often to make sure you are getting the right amount of medicine. Once this condition has been diagnosed, it is important to start treatment as soon as possible. For some people, epilepsy goes away in time. Others will need treatment for the rest of their life. Follow these instructions at home: Medicines  Take over-the-counter and prescription medicines only as told by your doctor.  Avoid anything that may keep your medicine  from working, such as alcohol. Activity  Get enough rest. Lack of sleep can make seizures more likely to occur.  Follow your doctor's advice about driving, swimming, and doing anything else that would be dangerous if you had a seizure. ? If you live in the [place] check with your local DMV (department of motor vehicles) to find out about local driving laws. Each state has rules  about when you can return to driving. Teaching others Teach friends and family what to do if you have a seizure. They should:  Lay you on the ground to prevent a fall.  Cushion your head and body.  Loosen any tight clothing around your neck.  Turn you on your side.  Stay with you until you are better.  Not hold you down.  Not put anything in your mouth.  Know whether or not you need emergency care.  General instructions  Avoid anything that causes you to have seizures.  Keep a seizure diary. Write down what you remember about each seizure. Be sure to include what might have caused it.  Keep all follow-up visits as told by your doctor. This is important. Contact a doctor if:  You have a change in how often or when you have seizures.  You get an infection or start to feel sick. You may have more seizures when you are sick. Get help right away if:  A seizure does not stop after 5 minutes.  You have more than one seizure in a row, and you do not have enough time between the seizures to feel better.  A seizure makes it harder to breathe.  A seizure is different from other seizures you have had.  A seizure makes you unable to speak or use a part of your body.  You did not wake up right after a seizure. These symptoms may be an emergency. Do not wait to see if the symptoms will go away. Get medical help right away. Call your local emergency services (911 in the [place] Do not drive yourself to the hospital. Summary  Epilepsy is when a person keeps having seizures. A seizure is a burst of abnormal activity in the brain.  Treatment can control seizures.  Teach friends and family what to do if you have a seizure. This information is not intended to replace advice given to you by your health care provider. Make sure you discuss any questions you have with your health care provider. Document Revised: 06/01/2018 Document Reviewed: 06/01/2018 Elsevier Patient Education  2020  [employer].

## 2020-09-22 ENCOUNTER — Other Ambulatory Visit: Payer: Self-pay

## 2020-09-22 ENCOUNTER — Encounter (HOSPITAL_COMMUNITY): Payer: Self-pay | Admitting: Emergency Medicine

## 2020-09-22 ENCOUNTER — Emergency Department (HOSPITAL_COMMUNITY): Payer: BC Managed Care – PPO

## 2020-09-22 ENCOUNTER — Emergency Department (HOSPITAL_COMMUNITY)
Admission: EM | Admit: 2020-09-22 | Discharge: 2020-09-22 | Disposition: A | Discharge status: Home / Self Care | Payer: BC Managed Care – PPO | Attending: Emergency Medicine | Admitting: Emergency Medicine

## 2020-09-22 ENCOUNTER — Encounter (HOSPITAL_COMMUNITY): Payer: Self-pay

## 2020-09-22 DIAGNOSIS — Z5321 Procedure and treatment not carried out due to patient leaving prior to being seen by health care provider: Secondary | ICD-10-CM | POA: Insufficient documentation

## 2020-09-22 DIAGNOSIS — M5441 Lumbago with sciatica, right side: Secondary | ICD-10-CM | POA: Insufficient documentation

## 2020-09-22 DIAGNOSIS — Y9389 Activity, other specified: Secondary | ICD-10-CM | POA: Insufficient documentation

## 2020-09-22 DIAGNOSIS — E119 Type 2 diabetes mellitus without complications: Secondary | ICD-10-CM | POA: Insufficient documentation

## 2020-09-22 DIAGNOSIS — Y92513 Shop (commercial) as the place of occurrence of the external cause: Secondary | ICD-10-CM | POA: Insufficient documentation

## 2020-09-22 DIAGNOSIS — M5442 Lumbago with sciatica, left side: Secondary | ICD-10-CM | POA: Insufficient documentation

## 2020-09-22 DIAGNOSIS — Z79899 Other long term (current) drug therapy: Secondary | ICD-10-CM | POA: Diagnosis not present

## 2020-09-22 DIAGNOSIS — X500XXA Overexertion from strenuous movement or load, initial encounter: Secondary | ICD-10-CM | POA: Diagnosis not present

## 2020-09-22 DIAGNOSIS — M545 Low back pain, unspecified: Secondary | ICD-10-CM | POA: Diagnosis present

## 2020-09-22 DIAGNOSIS — Z8616 Personal history of COVID-19: Secondary | ICD-10-CM | POA: Diagnosis not present

## 2020-09-22 DIAGNOSIS — Z7984 Long term (current) use of oral hypoglycemic drugs: Secondary | ICD-10-CM | POA: Insufficient documentation

## 2020-09-22 DIAGNOSIS — J45909 Unspecified asthma, uncomplicated: Secondary | ICD-10-CM | POA: Insufficient documentation

## 2020-09-22 DIAGNOSIS — M79605 Pain in left leg: Secondary | ICD-10-CM | POA: Insufficient documentation

## 2020-09-22 DIAGNOSIS — Z7982 Long term (current) use of aspirin: Secondary | ICD-10-CM | POA: Diagnosis not present

## 2020-09-22 DIAGNOSIS — M79604 Pain in right leg: Secondary | ICD-10-CM | POA: Insufficient documentation

## 2020-09-22 MED ORDER — KETOROLAC TROMETHAMINE 60 MG/2ML IM SOLN
60.0000 mg | Freq: Once | INTRAMUSCULAR | Status: AC
  Administered 2020-09-22: 60 mg via INTRAMUSCULAR
  Filled 2020-09-22: qty 2

## 2020-09-22 MED ORDER — CELECOXIB 200 MG PO CAPS: 200.0000 mg | ORAL_CAPSULE | Freq: Two times a day (BID) | ORAL | 0 refills | Status: AC

## 2020-09-22 MED ORDER — CYCLOBENZAPRINE HCL 10 MG PO TABS: 5.0000 mg | ORAL_TABLET | Freq: Two times a day (BID) | ORAL | 0 refills | Status: DC | PRN

## 2020-09-22 NOTE — ED Provider Notes (Signed)
[facility]-EMERGENCY DEPT Provider Note   CSN: [id number] Arrival date & time: 09/22/20  1536     History Chief Complaint  Patient presents with  . Back Pain    Isaiah Spencer is a 54 y.o. male   The history is provided by the patient.  Back Pain Location:  Lumbar spine Quality:  Aching, shooting and cramping Radiates to:  R posterior upper leg and L posterior upper leg Pain severity:  Severe Pain is:  Worse during the day Onset quality:  Gradual Duration:  2 weeks Timing:  Constant Progression:  Worsening Chronicity:  New Context: lifting heavy objects and occupational injury   Relieved by:  Nothing Worsened by:  Sitting Ineffective treatments:  Bed rest, cold packs, heating pad and ibuprofen Associated symptoms: leg pain   Associated symptoms: no abdominal pain, no abdominal swelling, no bladder incontinence, no bowel incontinence, no chest pain, no dysuria, no fever, no headaches, no numbness, no paresthesias, no pelvic pain, no perianal numbness, no tingling, no weakness and no weight loss   Risk factors: lack of exercise        Past Medical History:  Diagnosis Date  . Allergy   . Asthma    AS A CHILD  . COVID-19   . Diabetes mellitus without complication (HCC)   . GERD (gastroesophageal reflux disease)   . H. pylori infection   . Hemorrhoids   . Hyperlipemia   . OSA (obstructive sleep apnea)    does not use c pap    Patient Active Problem List   Diagnosis Date Noted  . Seizure (HCC) 07/28/2020  . Dyslipidemia 11/20/2018  . Chest discomfort 01/21/2014  . OSA (obstructive sleep apnea) 11/02/2012  . Diabetes mellitus, type 2 (HCC) 07/30/2012  . Dysphagia, unspecified(787.20) 12/16/2011  . GERD (gastroesophageal reflux disease) 12/07/2011  . H. pylori infection 12/07/2011  . Internal hemorrhoids 07/10/2011    Past Surgical History:  Procedure Laterality Date  . EXAMINATION UNDER ANESTHESIA  11/21/2011   Procedure: EXAM UNDER  ANESTHESIA;  Surgeon: Atilano Ina, MD;  Location: [facility] OR;  Service: General;  Laterality: N/A;  . HEMORRHOID SURGERY  11/21/2011   Procedure: HEMORRHOIDECTOMY;  Surgeon: Atilano Ina, MD;  Location: [facility] OR;  Service: General;  Laterality: N/A;  Excisional hemorrhoidectomy times two  . SUPERFICIAL LYMPH NODE BIOPSY / EXCISION  90's   non cancerous       Family History  Problem Relation Age of Onset  . Heart disease Mother 19       heart attack   . Diabetes Mother   . Hypertension Mother   . Colon cancer Neg Hx   . Rectal cancer Neg Hx   . Stomach cancer Neg Hx   . Esophageal cancer Neg Hx   . Seizures Neg Hx     Social History   Tobacco Use  . Smoking status: Never Smoker  . Smokeless tobacco: Never Used  Vaping Use  . Vaping Use: Never used  Substance Use Topics  . Alcohol use: Yes    Comment: socially, if he drinks its maybe 2 drinks in a week  . Drug use: No    Home Medications Prior to Admission medications   Medication Sig Start Date End Date Taking? Authorizing Provider  aspirin 325 MG tablet Take 325 mg by mouth daily.    [provider]  docusate sodium (COLACE) 100 MG capsule Take 100 mg by mouth daily.    [provider]  ergocalciferol (  VITAMIN D2) 1.25 MG (50000 UT) capsule Take 50,000 Units by mouth once a week. Tuesday 07/24/20   [provider]  hydrOXYzine (ATARAX/VISTARIL) 10 MG tablet Take 1 tablet (10 mg total) by mouth every 6 (six) hours as needed for anxiety. Patient not taking: Reported on 08/07/2020 07/29/20   Marguerita Merles Latif, DO  levETIRAcetam (KEPPRA) 750 MG tablet Take 1 tablet (750 mg total) by mouth 2 (two) times daily. 08/07/20   Micki Riley, MD  lisinopril (ZESTRIL) 2.5 MG tablet Take 2.5 mg by mouth daily. 05/10/20   [provider]  metFORMIN (GLUCOPHAGE) 1000 MG tablet Take 1,000 mg by mouth 2 (two) times daily. 06/23/20   [provider]  Multiple Vitamins-Minerals (MULTI FOR HIM 50+ PO)  Take 1 tablet by mouth daily.    [provider]  omeprazole (PRILOSEC) 40 MG capsule Take 40 mg by mouth daily. 03/10/20   [provider]  ondansetron (ZOFRAN) 4 MG tablet Take 1 tablet (4 mg total) by mouth every 6 (six) hours as needed for nausea. Patient not taking: Reported on 08/07/2020 07/29/20   Marguerita Merles Latif, DO    Allergies    Amoxicillin-pot clavulanate and Penicillins  Review of Systems   Review of Systems  Constitutional: Negative for fever and weight loss.  Cardiovascular: Negative for chest pain.  Gastrointestinal: Negative for abdominal pain and bowel incontinence.  Genitourinary: Negative for bladder incontinence, dysuria and pelvic pain.  Musculoskeletal: Positive for back pain.  Neurological: Negative for tingling, weakness, numbness, headaches and paresthesias.    Physical Exam Updated Vital Signs BP 113/79 (BP Location: Left Arm)   Pulse 86   Temp 97.9 F (36.6 C) (Oral)   Resp 16   Ht 6\' 1"  (1.854 m)   Wt 88.5 kg   SpO2 97%   BMI 25.73 kg/m   Physical Exam Vitals and nursing note reviewed.  Constitutional:      General: He is not in acute distress.    Appearance: He is well-developed. He is not diaphoretic.  HENT:     Head: Normocephalic and atraumatic.  Eyes:     General: No scleral icterus.    Conjunctiva/sclera: Conjunctivae normal.  Cardiovascular:     Rate and Rhythm: Normal rate and regular rhythm.     Heart sounds: Normal heart sounds.  Pulmonary:     Effort: Pulmonary effort is normal. No respiratory distress.     Breath sounds: Normal breath sounds.  Abdominal:     Palpations: Abdomen is soft.     Tenderness: There is no abdominal tenderness.  Musculoskeletal:     Cervical back: Normal range of motion and neck supple.     Comments: Patient appears to be in mild to moderate pain, antalgic gait noted. Lumbosacral spine area reveals no local tenderness or mass. Painful and reduced LS ROM noted. Straight leg raise  is positive. DTR's, motor strength and sensation normal, including heel and toe gait.  Peripheral pulses are palpable.  Skin:    General: Skin is warm and dry.  Neurological:     Mental Status: He is alert.  Psychiatric:        Behavior: Behavior normal.     ED Results / Procedures / Treatments   Labs (all labs ordered are listed, but only abnormal results are displayed) Labs Reviewed - No data to display  EKG None  Radiology No results found.  Procedures Procedures (including critical care time)  Medications Ordered in ED Medications  ketorolac (TORADOL) injection  60 mg (has no administration in time range)    ED Course  I have reviewed the triage vital signs and the nursing notes.  Pertinent labs & imaging results that were available during my care of the patient were reviewed by me and considered in my medical decision making (see chart for details).    MDM Rules/Calculators/A&P                          Patient with back pain.  I ordered and reviewed images which showed degenerative change without acute abnormality no neurological deficits and normal neuro exam.  Patient can walk but states is painful.  No loss of bowel or bladder control.  No concern for cauda equina.  No fever, night sweats, weight loss, h/o cancer, IVDU.  RICE protocol and pain medicine indicated and discussed with patient.   Final Clinical Impression(s) / ED Diagnoses Final diagnoses:  None    Rx / DC Orders ED Discharge Orders    None       Arthor Captain, PA-C 09/22/20 2222    Jacalyn Lefevre, MD 09/22/20 2234

## 2020-09-22 NOTE — Discharge Instructions (Addendum)
Your Xray showed multilevel degenerative changes without loss of disc height. I have attached the results to your discharge paperwork. I am discharging you with antiinflammatories,  and muscle relaxers. Please follow closely with your primary care doctor as soon as possible for further work up and referral.  SEEK IMMEDIATE MEDICAL ATTENTION IF: New numbness, tingling, weakness, or problem with the use of your arms or legs.  Severe back pain not relieved with medications.  Change in bowel or bladder control.  Increasing pain in any areas of the body (such as chest or abdominal pain).  Shortness of breath, dizziness or fainting.  Nausea (feeling sick to your stomach), vomiting, fever, or sweats.

## 2020-09-22 NOTE — ED Triage Notes (Signed)
Pt reports lower back pain x 2 weeks. Pain radiates down back into legs, creating a burning sensation. Has recently started working in [employer], more activity than usual, but denies specific injury.

## 2020-09-22 NOTE — ED Triage Notes (Signed)
Patient c/o bilateral lower back pain x 2 weeks and worse in the past few days. patient also c/o pain radiating into both legs. 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

## 2020-09-22 NOTE — ED Notes (Signed)
No answer when called for treatment room.  ?

## 2020-11-20 ENCOUNTER — Encounter: Payer: Self-pay | Admitting: Adult Health

## 2020-11-20 ENCOUNTER — Ambulatory Visit: Payer: Self-pay | Admitting: Adult Health
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
## 2020-12-17 ENCOUNTER — Other Ambulatory Visit: Payer: Self-pay | Admitting: Neurology

## 2021-06-30 ENCOUNTER — Other Ambulatory Visit: Payer: Self-pay | Admitting: Neurology

## 2021-07-16 ENCOUNTER — Other Ambulatory Visit: Payer: Self-pay | Admitting: Neurology

## 2021-07-16 NOTE — Telephone Encounter (Signed)
LVM for patient.  Patient must have follow up for continued refills.  Last visit was 08/07/21 with Dr. Pearlean Brownie and a no show on 11/20/20 with Shanda Bumps.  Notice was given to patient that follow up was needed for further refills on last refill.    Patient may ask PCP to manage medication and refills if he wishes.    Patent given 2 weeks medication refill.

## 2021-08-10 ENCOUNTER — Telehealth: Payer: Self-pay | Admitting: Neurology

## 2021-08-13 NOTE — Telephone Encounter (Signed)
Please call patient and advise him that we cannot refill his keppra without an appointment. He has not been seen in over one year.

## 2021-08-16 NOTE — Telephone Encounter (Signed)
Pt was called and he became very argumentative and asking questions that an RN would have to answer. Please call back to answer several questions he has about the Keppra.

## 2021-08-16 NOTE — Telephone Encounter (Signed)
Spoke to pt. States he would like to d/c keppra ,  Advised him he is due for an appt, and he can discuss this with MD.  Appt made with Dr Pearlean Brownie on 10/02/21.

## 2021-10-02 ENCOUNTER — Ambulatory Visit: Payer: Self-pay | Admitting: Neurology

## 2023-09-19 ENCOUNTER — Emergency Department (HOSPITAL_COMMUNITY)
Admission: EM | Admit: 2023-09-19 | Discharge: 2023-09-19 | Disposition: A | Discharge status: Home / Self Care | Payer: BC Managed Care – PPO | Attending: Emergency Medicine | Admitting: Emergency Medicine

## 2023-09-19 ENCOUNTER — Other Ambulatory Visit: Payer: Self-pay

## 2023-09-19 DIAGNOSIS — M5442 Lumbago with sciatica, left side: Secondary | ICD-10-CM | POA: Insufficient documentation

## 2023-09-19 DIAGNOSIS — E119 Type 2 diabetes mellitus without complications: Secondary | ICD-10-CM | POA: Insufficient documentation

## 2023-09-19 DIAGNOSIS — M545 Low back pain, unspecified: Secondary | ICD-10-CM | POA: Diagnosis present

## 2023-09-19 DIAGNOSIS — J45909 Unspecified asthma, uncomplicated: Secondary | ICD-10-CM | POA: Insufficient documentation

## 2023-09-19 DIAGNOSIS — Z7982 Long term (current) use of aspirin: Secondary | ICD-10-CM | POA: Insufficient documentation

## 2023-09-19 DIAGNOSIS — Z7984 Long term (current) use of oral hypoglycemic drugs: Secondary | ICD-10-CM | POA: Insufficient documentation

## 2023-09-19 MED ORDER — METHYLPREDNISOLONE 4 MG PO TBPK: ORAL_TABLET | ORAL | 0 refills | Status: AC

## 2023-09-19 MED ORDER — METHYLPREDNISOLONE 4 MG PO TBPK: ORAL_TABLET | ORAL | 0 refills | Status: DC

## 2023-09-19 MED ORDER — CYCLOBENZAPRINE HCL 10 MG PO TABS: 10.0000 mg | ORAL_TABLET | Freq: Two times a day (BID) | ORAL | 0 refills | Status: AC | PRN

## 2023-09-19 NOTE — ED Triage Notes (Signed)
Pt arrived via POV. C/o sciatica in L side. Has had for over a year, but has gotten worse over past few weeks.

## 2023-09-19 NOTE — ED Provider Notes (Signed)
EMERGENCY DEPARTMENT AT [facility] Provider Note   CSN: [id number] Arrival date & time: 09/19/23  1254     History  Chief Complaint  Patient presents with   Sciatica    Isaiah Spencer is a 57 y.o. male.  With history of DM2 with last A1c of 7, GERD, hyperlipidemia, OSA, asthma presenting for evaluation of left-sided low back pain with radiation into the hip and left upper leg.  States he has bilateral lower back spasms on and off which has been present for the past couple of years.  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  Radiates down the lateral aspect of the left.  He denies any numbness, weakness or tingling.  No calf pain or swelling.  No saddle anesthesia, urinary or fecal incontinence, history of injection drug use, fevers or chills.  He denies any recent falls or trauma.  States he called his primary care provider office and was encouraged to come to the emergency department "to just get some x-rays."  HPI     Home Medications Prior to Admission medications   Medication Sig Start Date End Date Taking? Authorizing Provider  cyclobenzaprine (FLEXERIL) 10 MG tablet Take 1 tablet (10 mg total) by mouth 2 (two) times daily as needed for up to 7 days for muscle spasms. 09/19/23 09/26/23 Yes Kavir Savoca, Edsel Petrin, PA-C  aspirin 325 MG tablet Take 325 mg by mouth daily.    [provider]  celecoxib (CELEBREX) 200 MG capsule Take 1 capsule (200 mg total) by mouth 2 (two) times daily. 09/22/20   Arthor Captain, PA-C  docusate sodium (COLACE) 100 MG capsule Take 100 mg by mouth daily.    [provider]  ergocalciferol (VITAMIN D2) 1.25 MG (50000 UT) capsule Take 50,000 Units by mouth once a week. Tuesday 07/24/20   [provider]  hydrOXYzine (ATARAX/VISTARIL) 10 MG tablet Take 1 tablet (10 mg  total) by mouth every 6 (six) hours as needed for anxiety. Patient not taking: Reported on 08/07/2020 07/29/20   Marguerita Merles Latif, DO  levETIRAcetam (KEPPRA) 750 MG tablet TAKE 1 TABLET BY MOUTH TWICE A DAY *NEED APPOINTMENT FOR REFILLS* 07/16/21   Micki Riley, MD  lisinopril (ZESTRIL) 2.5 MG tablet Take 2.5 mg by mouth daily. 05/10/20   [provider]  metFORMIN (GLUCOPHAGE) 1000 MG tablet Take 1,000 mg by mouth 2 (two) times daily. 06/23/20   [provider]  methylPREDNISolone (MEDROL DOSEPAK) 4 MG TBPK tablet Use as directed on packaging 09/19/23   Neelie Welshans, Edsel Petrin, PA-C  Multiple Vitamins-Minerals (MULTI FOR HIM 50+ PO) Take 1 tablet by mouth daily.    [provider]  omeprazole (PRILOSEC) 40 MG capsule Take 40 mg by mouth daily. 03/10/20   [provider]  ondansetron (ZOFRAN) 4 MG tablet Take 1 tablet (4 mg total) by mouth every 6 (six) hours as needed for nausea. Patient not taking: Reported on 08/07/2020 07/29/20   Marguerita Merles Latif, DO      Allergies    Amoxicillin-pot clavulanate and Penicillins    Review of Systems   Review of Systems  Musculoskeletal:  Positive for back pain.  All other systems reviewed and are negative.   Physical Exam Updated Vital Signs BP 130/68 (BP Location: Right Arm)   Pulse (!) 101   Temp 98.1  F (36.7 C) (Oral)   Resp 18   Ht 6\' 1"  (1.854 m)   Wt 88.5 kg   SpO2 95%   BMI 25.73 kg/m  Physical Exam Vitals and nursing note reviewed.  Constitutional:      General: He is not in acute distress.    Appearance: Normal appearance. He is normal weight. He is not ill-appearing.  HENT:     Head: Normocephalic and atraumatic.  Pulmonary:     Effort: Pulmonary effort is normal. No respiratory distress.  Abdominal:     General: Abdomen is flat.  Musculoskeletal:        General: Normal range of motion.     Cervical back: Neck supple.     Comments: No midline T or L-spine TTP, step-offs, deformities,  crepitus.  No muscular TTP to the lumbar back.  No rashes.  Mild antalgic gait.  Strength 5 out of 5 with knee flexion and extension bilaterally.  Sensation intact distally.  Compartments are soft.  No swelling or deformities.  Straight leg raise negative on the right, positive on left.  Skin:    General: Skin is warm and dry.  Neurological:     Mental Status: He is alert and oriented to person, place, and time.  Psychiatric:        Mood and Affect: Mood normal.        Behavior: Behavior normal.     ED Results / Procedures / Treatments   Labs (all labs ordered are listed, but only abnormal results are displayed) Labs Reviewed - No data to display  EKG None  Radiology No results found.  Procedures Procedures    Medications Ordered in ED Medications - No data to display  ED Course/ Medical Decision Making/ A&P                                 Medical Decision Making This patient presents to the ED for concern of left low back pain, this involves an extensive number of treatment options, and is a complaint that carries with it a high risk of complications and morbidity.  Emergent considerations in the differential diagnosis of back pain include:occult fracture, congenital anomalies, tumors, vascular catastrophes, osteomyelitis of vertebrae, infections of disc, meninges or cord, space occupying lesions within canal leading to cord or root compression including epidural abscess.   Additional history obtained from: Nursing notes from this visit.  Afebrile, hemodynamically stable.  57 year old male presenting for evaluation of left-sided low back pain with radiation down into the left hip and upper leg.  No neurologic complaints.  No red flag signs or symptoms to suggest cord compression.  No history of trauma.  No midline tenderness to palpation of the L-spine.  Mild antalgic gait which reproduces the pain.  Overall suspect sciatica.  Given lack of history of trauma, midline  tenderness, neurologic deficits do not believe patient would benefit from x-ray imaging at this time.  He has been taking Celebrex with minimal improvement in his symptoms.  Will treat with muscle relaxer, steroid burst and give contact information for spine specialists.  He was given return precautions.  Stable at discharge.  At this time there does not appear to be any evidence of an acute emergency medical condition and the patient appears stable for discharge with appropriate outpatient follow up. Diagnosis was discussed with patient who verbalizes understanding of care plan and is agreeable to discharge. I have discussed  return precautions with patient who verbalizes understanding. Patient encouraged to follow-up with their PCP within 1 week. All questions answered.  Note: Portions of this report may have been transcribed using voice recognition software. Every effort was made to ensure accuracy; however, inadvertent computerized transcription errors may still be present.        Final Clinical Impression(s) / ED Diagnoses Final diagnoses:  Acute left-sided low back pain with left-sided sciatica    Rx / DC Orders ED Discharge Orders          Ordered    methylPREDNISolone (MEDROL DOSEPAK) 4 MG TBPK tablet  Status:  Discontinued        09/19/23 1336    cyclobenzaprine (FLEXERIL) 10 MG tablet  2 times daily PRN        09/19/23 1336    methylPREDNISolone (MEDROL DOSEPAK) 4 MG TBPK tablet        09/19/23 1338              Michelle Piper, PA-C 09/19/23 1338    Pricilla Loveless, MD 09/20/23 [phone]

## 2023-09-19 NOTE — Discharge Instructions (Addendum)
You have been seen today for your complaint of left-sided low back pain. Your discharge medications include methylprednisolone.  This is a steroid.  Take it as prescribed and for the entire duration of the prescription Flexeril. This is a muscle relaxer. It may cause drowsiness. Do not drive, operate heavy machinery or make important decisions when taking this medication. Only take it at night until you know how it affects you. Only take it as needed and take other medications such as ibuprofen or tylenol prior to trying this medication. Follow up with: Spine specialists.  Call the phone number listed in this packet to schedule follow-up appointment Please seek immediate medical care if you develop any of the following symptoms: Reducing or modifying physical activity. Exercising, including strengthening and stretching. Icing and applying heat to the affected area. Medicines that help to: Relieve pain and swelling. Relax your muscles. Injections of medicines that help to relieve pain and inflammation (steroids) around the sciatic nerve. Surgery. At this time there does not appear to be the presence of an emergent medical condition, however there is always the potential for conditions to change. Please read and follow the below instructions.  Do not take your medicine if  develop an itchy rash, swelling in your mouth or lips, or difficulty breathing; call 911 and seek immediate emergency medical attention if this occurs.  You may review your lab tests and imaging results in their entirety on your MyChart account.  Please discuss all results of fully with your primary care provider and other specialist at your follow-up visit.  Note: Portions of this text may have been transcribed using voice recognition software. Every effort was made to ensure accuracy; however, inadvertent computerized transcription errors may still be present.
